# Patient Record
Sex: Female | Born: 1965 | Hispanic: No | Marital: Married | Smoking: Former smoker
Health system: Southern US, Community
[De-identification: ages and names within clinical notes are randomized; demographics above are authoritative.]

## PROBLEM LIST (undated history)

## (undated) DIAGNOSIS — J40 Bronchitis, not specified as acute or chronic: Secondary | ICD-10-CM

## (undated) HISTORY — PX: TOE SURGERY: SHX1073

## (undated) HISTORY — PX: CHOLECYSTECTOMY: SHX55

---

## 2006-12-29 LAB — HM PAP SMEAR

## 2009-08-08 ENCOUNTER — Other Ambulatory Visit: Admission: RE | Admit: 2009-08-08 | Discharge: 2009-08-08 | Payer: Self-pay | Admitting: *Deleted

## 2011-05-19 ENCOUNTER — Ambulatory Visit (INDEPENDENT_AMBULATORY_CARE_PROVIDER_SITE_OTHER): Payer: BC Managed Care – PPO | Admitting: Family Medicine

## 2011-05-19 ENCOUNTER — Encounter: Payer: Self-pay | Admitting: Family Medicine

## 2011-05-19 DIAGNOSIS — F331 Major depressive disorder, recurrent, moderate: Secondary | ICD-10-CM | POA: Insufficient documentation

## 2011-05-19 DIAGNOSIS — D696 Thrombocytopenia, unspecified: Secondary | ICD-10-CM

## 2011-05-19 DIAGNOSIS — N6019 Diffuse cystic mastopathy of unspecified breast: Secondary | ICD-10-CM | POA: Insufficient documentation

## 2011-05-19 DIAGNOSIS — F410 Panic disorder [episodic paroxysmal anxiety] without agoraphobia: Secondary | ICD-10-CM

## 2011-05-19 DIAGNOSIS — IMO0002 Reserved for concepts with insufficient information to code with codable children: Secondary | ICD-10-CM

## 2011-05-19 DIAGNOSIS — D6941 Evans syndrome: Secondary | ICD-10-CM

## 2011-05-19 DIAGNOSIS — M791 Myalgia, unspecified site: Secondary | ICD-10-CM

## 2011-05-19 DIAGNOSIS — F329 Major depressive disorder, single episode, unspecified: Secondary | ICD-10-CM

## 2011-05-19 DIAGNOSIS — F4000 Agoraphobia, unspecified: Secondary | ICD-10-CM | POA: Insufficient documentation

## 2011-05-19 DIAGNOSIS — F4001 Agoraphobia with panic disorder: Secondary | ICD-10-CM

## 2011-05-19 DIAGNOSIS — M797 Fibromyalgia: Secondary | ICD-10-CM

## 2011-05-19 DIAGNOSIS — R5383 Other fatigue: Secondary | ICD-10-CM

## 2011-05-19 DIAGNOSIS — Z13220 Encounter for screening for lipoid disorders: Secondary | ICD-10-CM

## 2011-05-19 DIAGNOSIS — T7422XA Child sexual abuse, confirmed, initial encounter: Secondary | ICD-10-CM

## 2011-05-19 DIAGNOSIS — IMO0001 Reserved for inherently not codable concepts without codable children: Secondary | ICD-10-CM

## 2011-05-19 DIAGNOSIS — D6861 Antiphospholipid syndrome: Secondary | ICD-10-CM | POA: Insufficient documentation

## 2011-05-19 NOTE — Assessment & Plan Note (Signed)
Poor control. Limits her activity as she does not leave house.  Husband not available to discuss possible referral to pshychiatrist.

## 2011-05-19 NOTE — Assessment & Plan Note (Signed)
Pt very agitated during exam. 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 Offered referral to pshychiatrist, pt not sure if she is interested in getting back on medication and will discuss with her husband.
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
## 2011-05-19 NOTE — Assessment & Plan Note (Signed)
?   Where diagnosis made for pt or if this is a correct diagnosis (She keeps saying fibro-cystic-myalgia and was diagnosed supposedly by breast MD as she cannot have mammogram on right breast). Will obtain past records from previous  Surgeon as per pt pain started after gallbladder surgery as well as neurologist. Pain is isolated to right side...will eval B12, TSH etc. She is hesitant abut medication to treat such as cymbalta, neurontin, etc.

## 2011-05-19 NOTE — Assessment & Plan Note (Signed)
Will reeval cbc to assure this is not causing pt's fatigue.

## 2011-05-19 NOTE — Patient Instructions (Signed)
Come in when able for lab evaluation. We will discuss results at complete physical appointment Call if interested in psychiatric referral. We will review current pain issues at complete physical exam.

## 2011-06-20 ENCOUNTER — Other Ambulatory Visit (INDEPENDENT_AMBULATORY_CARE_PROVIDER_SITE_OTHER): Payer: BC Managed Care – PPO

## 2011-06-20 DIAGNOSIS — F329 Major depressive disorder, single episode, unspecified: Secondary | ICD-10-CM

## 2011-06-20 DIAGNOSIS — R5383 Other fatigue: Secondary | ICD-10-CM

## 2011-06-20 DIAGNOSIS — M791 Myalgia, unspecified site: Secondary | ICD-10-CM

## 2011-06-20 DIAGNOSIS — Z13220 Encounter for screening for lipoid disorders: Secondary | ICD-10-CM

## 2011-06-20 DIAGNOSIS — D6941 Evans syndrome: Secondary | ICD-10-CM

## 2011-06-20 DIAGNOSIS — D696 Thrombocytopenia, unspecified: Secondary | ICD-10-CM

## 2011-06-20 DIAGNOSIS — IMO0001 Reserved for inherently not codable concepts without codable children: Secondary | ICD-10-CM

## 2011-06-20 LAB — CBC WITH DIFFERENTIAL/PLATELET
Eosinophils Relative: 1.8 % (ref 0.0–5.0)
HCT: 39.3 % (ref 36.0–46.0)
Monocytes Relative: 8.6 % (ref 3.0–12.0)
Neutrophils Relative %: 48.3 % (ref 43.0–77.0)
Platelets: 254 10*3/uL (ref 150.0–400.0)
WBC: 7.2 10*3/uL (ref 4.5–10.5)

## 2011-06-20 LAB — LIPID PANEL
LDL Cholesterol: 115 mg/dL — ABNORMAL HIGH (ref 0–99)
Total CHOL/HDL Ratio: 3
VLDL: 9.8 mg/dL (ref 0.0–40.0)

## 2011-06-20 LAB — COMPREHENSIVE METABOLIC PANEL
Albumin: 4.2 g/dL (ref 3.5–5.2)
CO2: 29 mEq/L (ref 19–32)
Chloride: 103 mEq/L (ref 96–112)
GFR: 154.76 mL/min (ref >60.00)
Glucose, Bld: 91 mg/dL (ref 70–99)
Potassium: 4.3 mEq/L (ref 3.5–5.1)
Sodium: 138 mEq/L (ref 135–145)
Total Protein: 6.6 g/dL (ref 6.0–8.3)

## 2011-06-20 LAB — TSH: TSH: 3.62 u[IU]/mL (ref 0.35–5.50)

## 2011-06-21 LAB — VITAMIN D 25 HYDROXY (VIT D DEFICIENCY, FRACTURES): Vit D, 25-Hydroxy: 29 ng/mL — ABNORMAL LOW (ref 30–89)

## 2011-06-27 ENCOUNTER — Ambulatory Visit (INDEPENDENT_AMBULATORY_CARE_PROVIDER_SITE_OTHER): Payer: BC Managed Care – PPO | Admitting: Family Medicine

## 2011-06-27 ENCOUNTER — Other Ambulatory Visit (HOSPITAL_COMMUNITY)
Admission: RE | Admit: 2011-06-27 | Discharge: 2011-06-27 | Disposition: A | Discharge status: Home / Self Care | Payer: BC Managed Care – PPO | Source: Ambulatory Visit | Attending: Family Medicine | Admitting: Family Medicine

## 2011-06-27 ENCOUNTER — Encounter: Payer: Self-pay | Admitting: Family Medicine

## 2011-06-27 VITALS — BP 118/72 | HR 61 | Temp 97.6°F | Ht 65.5 in | Wt 128.8 lb

## 2011-06-27 DIAGNOSIS — Z01419 Encounter for gynecological examination (general) (routine) without abnormal findings: Secondary | ICD-10-CM | POA: Insufficient documentation

## 2011-06-27 DIAGNOSIS — Q631 Lobulated, fused and horseshoe kidney: Secondary | ICD-10-CM | POA: Insufficient documentation

## 2011-06-27 DIAGNOSIS — Z1159 Encounter for screening for other viral diseases: Secondary | ICD-10-CM | POA: Insufficient documentation

## 2011-06-27 DIAGNOSIS — Z1231 Encounter for screening mammogram for malignant neoplasm of breast: Secondary | ICD-10-CM

## 2011-06-27 DIAGNOSIS — N644 Mastodynia: Secondary | ICD-10-CM

## 2011-06-27 DIAGNOSIS — Q638 Other specified congenital malformations of kidney: Secondary | ICD-10-CM

## 2011-06-27 DIAGNOSIS — F329 Major depressive disorder, single episode, unspecified: Secondary | ICD-10-CM

## 2011-06-27 DIAGNOSIS — F4000 Agoraphobia, unspecified: Secondary | ICD-10-CM

## 2011-06-27 DIAGNOSIS — F410 Panic disorder [episodic paroxysmal anxiety] without agoraphobia: Secondary | ICD-10-CM

## 2011-06-27 DIAGNOSIS — Z23 Encounter for immunization: Secondary | ICD-10-CM

## 2011-06-27 NOTE — Patient Instructions (Addendum)
Start ca and vit D tabs 600 mg/40 IU  twice daily.  Stop at front desk to schedule a mammogram for left breast and for referral.

## 2011-06-27 NOTE — Progress Notes (Signed)
Subjective:    Patient ID: Jackie Ford, female    DOB: 26-Feb-1966, 45 y.o.   MRN: [id number]  HPI   The patient is here for annual wellness exam and preventative care.    Panic attacks , agoraphbia:  Continued issues with this.  She does admit pain was better controlled when anxiety was better controlled on zoloft and  Name brand xanax.  She did have bad experience with pshychiatrist in past.  Fibromyalgia and right sided body pain:   Review of Systems  Constitutional: Negative for fever, fatigue and unexpected weight change.  HENT: Negative for ear pain, congestion, sore throat, sneezing, trouble swallowing and sinus pressure.   Eyes: Negative for pain and itching.  Respiratory: Negative for cough, shortness of breath and wheezing.   Cardiovascular: Negative for chest pain, palpitations and leg swelling.  Gastrointestinal: Negative for nausea, abdominal pain, diarrhea, constipation and blood in stool.  Genitourinary: Negative for dysuria, hematuria, vaginal discharge, difficulty urinating and menstrual problem.       Has noted bruising on right breast.. No known injury   She has chronic pain and history of fibrocystiuc disease in right breast  Musculoskeletal:       Pain to palpation on right side of body.  Skin: Negative for rash.  Neurological: Negative for syncope, weakness, light-headedness, numbness and headaches.  Psychiatric/Behavioral: Negative for confusion and dysphoric mood. The patient is not nervous/anxious.        Objective:   Physical Exam  Constitutional: Vital signs are normal. She appears well-developed and well-nourished. She is cooperative.  Non-toxic appearance. She does not appear ill. No distress.  HENT:  Head: Normocephalic.  Right Ear: Hearing, tympanic membrane, external ear and ear canal normal.  Left Ear: Hearing, tympanic membrane, external ear and ear canal normal.  Nose: Nose normal.  Eyes: Conjunctivae, EOM and lids are normal. Pupils are  equal, round, and reactive to light. No foreign bodies found.  Neck: Trachea normal and normal range of motion. Neck supple. Carotid bruit is not present. No mass and no thyromegaly present.  Cardiovascular: Normal rate, regular rhythm, S1 normal, S2 normal, normal heart sounds and intact distal pulses.  Exam reveals no gallop.   No murmur heard. Pulmonary/Chest: Effort normal and breath sounds normal. No respiratory distress. She has no wheezes. She has no rhonchi. She has no rales.  Abdominal: Soft. Normal appearance and bowel sounds are normal. She exhibits no distension, no fluid wave, no abdominal bruit and no mass. There is no hepatosplenomegaly. There is no tenderness. There is no rebound, no guarding and no CVA tenderness. No hernia.  Genitourinary: Vagina normal and uterus normal. No breast swelling, tenderness, discharge or bleeding. Pelvic exam was performed with patient prone. There is no rash, tenderness or lesion on the right labia. There is no rash, tenderness or lesion on the left labia. Uterus is not enlarged and not tender. Cervix exhibits motion tenderness. Cervix exhibits no discharge and no friability. Right adnexum displays no mass, no tenderness and no fullness. Left adnexum displays no mass, no tenderness and no fullness.  Lymphadenopathy:    She has no cervical adenopathy.    She has no axillary adenopathy.  Neurological: She is alert. She has normal strength. No cranial nerve deficit or sensory deficit.  Skin: Skin is warm, dry and intact. No rash noted.  Psychiatric: Her speech is normal and behavior is normal. Judgment normal. Her mood appears not anxious. Cognition and memory are normal. She does not exhibit a depressed  mood.          Assessment & Plan:  Complete Physical Exam: The patient's preventative maintenance and recommended screening tests for an annual wellness exam were reviewed in full today. Brought up to date unless services declined.  Counselled on  the importance of diet, exercise, and its role in overall health and mortality. The patient's FH and SH was reviewed, including their home life, tobacco status, and drug and alcohol status.

## 2011-07-01 NOTE — Progress Notes (Signed)
Addended by: Kerby Nora E on: 07/01/2011 05:05 PM   Modules accepted: Orders

## 2011-07-04 ENCOUNTER — Encounter: Payer: Self-pay | Admitting: *Deleted

## 2011-09-29 ENCOUNTER — Telehealth: Payer: Self-pay | Admitting: *Deleted

## 2011-09-29 MED ORDER — TRAMADOL HCL 50 MG PO TABS: 50.0000 mg | ORAL_TABLET | Freq: Four times a day (QID) | ORAL | Status: AC | PRN

## 2011-09-29 NOTE — Telephone Encounter (Signed)
Pt needs a new order for mammogram sent to [facility], she had appt previously but didn't go, they told her that order was cancelled.  Also, she would like something for the pain that is involved in the procedure called to [address].  Husband says she was in pain for 3 months after a previous mammogram.  Please let him know when done.

## 2011-09-29 NOTE — Telephone Encounter (Signed)
Marion.. Do I need to reorder or can we simply send order again? Heather: sent in tramadol for pain to use only as needed.

## 2011-09-30 NOTE — Telephone Encounter (Signed)
Dr Ermalene Searing, I have faxed over to the [facility] patients last mammogram report that we have from 2002. I have spoken to the patients husband about this and apparently she has had more recent radiology done in 2007. Husband will call to get previous films sent to breast ctr and I ha=ve asked the breast ctr to let us know what you need to order for her.

## 2011-10-01 ENCOUNTER — Other Ambulatory Visit: Payer: Self-pay | Admitting: Family Medicine

## 2011-10-01 DIAGNOSIS — N644 Mastodynia: Secondary | ICD-10-CM

## 2011-10-01 NOTE — Telephone Encounter (Signed)
I thought we already ordered this months back.. Can we not use same order? She had cancelled appt.

## 2011-10-02 NOTE — Telephone Encounter (Signed)
[facility] sent Dr Ermalene Searing an order for Bilateral Diagnostic MMG and [place] with possible biopsy if needed. Order was signed and faxed to [facility]. Rayford Halsted will call the patient to schedule.

## 2011-10-03 ENCOUNTER — Ambulatory Visit (INDEPENDENT_AMBULATORY_CARE_PROVIDER_SITE_OTHER): Payer: BC Managed Care – PPO | Admitting: Family Medicine

## 2011-10-03 ENCOUNTER — Encounter: Payer: Self-pay | Admitting: Family Medicine

## 2011-10-03 DIAGNOSIS — N644 Mastodynia: Secondary | ICD-10-CM

## 2011-10-03 DIAGNOSIS — F329 Major depressive disorder, single episode, unspecified: Secondary | ICD-10-CM

## 2011-10-03 DIAGNOSIS — F4000 Agoraphobia, unspecified: Secondary | ICD-10-CM

## 2011-10-03 DIAGNOSIS — IMO0001 Reserved for inherently not codable concepts without codable children: Secondary | ICD-10-CM

## 2011-10-03 DIAGNOSIS — F410 Panic disorder [episodic paroxysmal anxiety] without agoraphobia: Secondary | ICD-10-CM

## 2011-10-03 DIAGNOSIS — M797 Fibromyalgia: Secondary | ICD-10-CM

## 2011-10-03 MED ORDER — SERTRALINE HCL 50 MG PO TABS: 50.0000 mg | ORAL_TABLET | Freq: Every day | ORAL | Status: DC

## 2011-10-03 NOTE — Patient Instructions (Addendum)
Start zoloft at bedtime. Call if side effects or worsening pain or mood.

## 2011-10-03 NOTE — Progress Notes (Signed)
  Subjective:    Patient ID: Jackie Ford, female    DOB: 08-07-1966, 45 y.o.   MRN: [id number]  HPI Right breast pain.. She is open to getting full mammogram done. We have prescribed tramadol to help with pain. She has history of cyst and abnormalities in right breast.  Fibromyalgia, right sided body pain: stable control but not great. Occurs when stressed out.  Depression, agoraphobia, anxiety, poor control... Never saw pshychiatrist..she got nervous and cancelled appt.  Trouble falling asleep due to fears.  Sleeps a lot during the day. Isolated at home...never leave house. Only talks with husband and animals.  Her animals are her family. Per husband she has things barricade in front of window to keep out people and criminals.  Had SE to cymbalta in past.  Has tolerated zoloft in past. She feels most comfortable with this medication. Nervous about other meds like lyrica, neurontin and amitryptiline.     Review of Systems  Constitutional: Negative for fever and fatigue.  HENT: Negative for ear pain.   Eyes: Negative for pain.  Respiratory: Negative for chest tightness and shortness of breath.   Cardiovascular: Negative for chest pain, palpitations and leg swelling.  Gastrointestinal: Negative for abdominal pain.  Genitourinary: Negative for dysuria.  Psychiatric/Behavioral: Positive for dysphoric mood and agitation. Negative for suicidal ideas, hallucinations and self-injury.       Objective:   Physical Exam  Constitutional: Vital signs are normal. She appears well-developed and well-nourished. She is cooperative.  Non-toxic appearance. She does not appear ill. No distress.  HENT:  Head: Normocephalic.  Right Ear: Hearing, tympanic membrane, external ear and ear canal normal. Tympanic membrane is not erythematous, not retracted and not bulging.  Left Ear: Hearing, tympanic membrane, external ear and ear canal normal. Tympanic membrane is not erythematous, not retracted and not  bulging.  Nose: No mucosal edema or rhinorrhea. Right sinus exhibits no maxillary sinus tenderness and no frontal sinus tenderness. Left sinus exhibits no maxillary sinus tenderness and no frontal sinus tenderness.  Mouth/Throat: Uvula is midline, oropharynx is clear and moist and mucous membranes are normal.  Eyes: Conjunctivae, EOM and lids are normal. Pupils are equal, round, and reactive to light. No foreign bodies found.  Neck: Trachea normal and normal range of motion. Neck supple. Carotid bruit is not present. No mass and no thyromegaly present.  Cardiovascular: Normal rate, regular rhythm, S1 normal, S2 normal, normal heart sounds, intact distal pulses and normal pulses.  Exam reveals no gallop and no friction rub.   No murmur heard. Pulmonary/Chest: Effort normal and breath sounds normal. Not tachypneic. No respiratory distress. She has no decreased breath sounds. She has no wheezes. She has no rhonchi. She has no rales.  Abdominal: Soft. Normal appearance and bowel sounds are normal. There is no tenderness.  Neurological: She is alert.  Skin: Skin is warm, dry and intact. No rash noted.  Psychiatric: Thought content normal. Her mood appears anxious. Her speech is tangential. She is agitated. Cognition and memory are normal. She expresses inappropriate judgment. She does not exhibit a depressed mood.          Assessment & Plan:

## 2011-10-10 DIAGNOSIS — N644 Mastodynia: Secondary | ICD-10-CM | POA: Insufficient documentation

## 2011-10-10 NOTE — Assessment & Plan Note (Addendum)
Chronic... Will go for mammogram B breasts, will use tramadol prior to procedure given severe pain with procedure in past.

## 2011-10-10 NOTE — Assessment & Plan Note (Signed)
Right sided body pain closely related to stress and anxiety... We will begin treatment with SSRI as opposed to other options because she has tried this in the  Past and has the least concerns about taking this.

## 2011-10-10 NOTE — Assessment & Plan Note (Signed)
Total visit time 30 minutes, > 50% spent counseling and cordinating patients care. I continue to recommend pshychiatry and counsleling... Pt very hesitant given past experiences despite husband being encouraging to the neccessity of treatment. We will begin zolfot 50 mg daily... Bipolar questionnaires negative... To help with her dramatic isolation due to agoraphobia and other fears.. As well as to try to begin treatment for her fibromyalgia.

## 2011-10-17 ENCOUNTER — Ambulatory Visit: Payer: BC Managed Care – PPO | Admitting: Family Medicine

## 2011-10-23 ENCOUNTER — Ambulatory Visit: Payer: BC Managed Care – PPO | Admitting: Family Medicine

## 2012-01-19 ENCOUNTER — Ambulatory Visit
Admission: RE | Admit: 2012-01-19 | Discharge: 2012-01-19 | Disposition: A | Discharge status: Home / Self Care | Payer: BC Managed Care – PPO | Source: Ambulatory Visit | Attending: Family Medicine | Admitting: Family Medicine

## 2012-01-19 ENCOUNTER — Other Ambulatory Visit: Payer: Self-pay | Admitting: Family Medicine

## 2012-01-19 ENCOUNTER — Encounter: Payer: Self-pay | Admitting: *Deleted

## 2012-01-19 DIAGNOSIS — N644 Mastodynia: Secondary | ICD-10-CM

## 2012-06-04 ENCOUNTER — Ambulatory Visit: Payer: Self-pay | Admitting: Family Medicine

## 2012-06-04 ENCOUNTER — Encounter: Payer: Self-pay | Admitting: Family Medicine

## 2012-06-04 ENCOUNTER — Ambulatory Visit (INDEPENDENT_AMBULATORY_CARE_PROVIDER_SITE_OTHER): Payer: BC Managed Care – PPO | Admitting: Family Medicine

## 2012-06-04 VITALS — BP 112/76 | HR 80 | Temp 98.6°F | Wt 127.8 lb

## 2012-06-04 DIAGNOSIS — M79609 Pain in unspecified limb: Secondary | ICD-10-CM

## 2012-06-04 DIAGNOSIS — D699 Hemorrhagic condition, unspecified: Secondary | ICD-10-CM | POA: Insufficient documentation

## 2012-06-04 DIAGNOSIS — D6941 Evans syndrome: Secondary | ICD-10-CM

## 2012-06-04 DIAGNOSIS — M79605 Pain in left leg: Secondary | ICD-10-CM | POA: Insufficient documentation

## 2012-06-04 LAB — CBC
Hemoglobin: 13.9 g/dL (ref 12.0–15.0)
MCH: 28.3 pg (ref 26.0–34.0)
MCV: 86.4 fL (ref 78.0–100.0)
Platelets: 299 10*3/uL (ref 150–400)
RBC: 4.92 MIL/uL (ref 3.87–5.11)
WBC: 7.3 10*3/uL (ref 4.0–10.5)

## 2012-06-04 NOTE — Patient Instructions (Signed)
Stop at lab and front desk to set up lab and dopplers of leg.

## 2012-06-04 NOTE — Assessment & Plan Note (Signed)
Will rule out DVT, although superficial phlebitits vs muscle strain more likely. If dopplers negative.. Treat with heat, massage, stretching.

## 2012-06-04 NOTE — Assessment & Plan Note (Signed)
Will evaluate with cbc.

## 2012-06-04 NOTE — Progress Notes (Signed)
Jackie Ford from Ultrasound called to report that the left leg doppler was negative for DVT.  Per Dr. Ermalene Searing, patient advised to go home and follow the instructions given at OV.

## 2012-06-04 NOTE — Progress Notes (Signed)
  Subjective:    Patient ID: Jackie Ford, female    DOB: 1966/11/16, 46 y.o.   MRN: [id number]  HPI 46 year old female presents with 1 week history bruising mark on left lateral leg.    No leg redness.   No known injury. Brusing now gone. Now with pain in left lateral leg, from hip to foot. No leg swelling.  Pain with standing, but greater when sitting.  Brother had thrombocytopenia.  Review of Systems  Constitutional: Negative for fever and fatigue.  Eyes: Negative for pain.  Respiratory: Negative for shortness of breath.   Cardiovascular: Negative for chest pain, palpitations and leg swelling.  Gastrointestinal: Negative for abdominal distention.  Genitourinary: Negative for dysuria.       Objective:   Physical Exam  Constitutional: Vital signs are normal. She appears well-developed and well-nourished. She is cooperative.  Non-toxic appearance. She does not appear ill. No distress.  HENT:  Head: Normocephalic.  Right Ear: Hearing, tympanic membrane, external ear and ear canal normal. Tympanic membrane is not erythematous, not retracted and not bulging.  Left Ear: Hearing, tympanic membrane, external ear and ear canal normal. Tympanic membrane is not erythematous, not retracted and not bulging.  Nose: No mucosal edema or rhinorrhea. Right sinus exhibits no maxillary sinus tenderness and no frontal sinus tenderness. Left sinus exhibits no maxillary sinus tenderness and no frontal sinus tenderness.  Mouth/Throat: Uvula is midline, oropharynx is clear and moist and mucous membranes are normal.  Eyes: Conjunctivae, EOM and lids are normal. Pupils are equal, round, and reactive to light. No foreign bodies found.  Neck: Trachea normal and normal range of motion. Neck supple. Carotid bruit is not present. No mass and no thyromegaly present.  Cardiovascular: Normal rate, regular rhythm, S1 normal, S2 normal, normal heart sounds, intact distal pulses and normal pulses.  Exam reveals no gallop  and no friction rub.   No murmur heard. Pulmonary/Chest: Effort normal and breath sounds normal. Not tachypneic. No respiratory distress. She has no decreased breath sounds. She has no wheezes. She has no rhonchi. She has no rales.  Abdominal: Soft. Normal appearance and bowel sounds are normal. There is no tenderness.  Musculoskeletal:       ttp in left buttock, left lateral leg and sole of foot, minimal swelling, no redness, no cord.  Most tender over area of healed brusing at tendon) Neg SLR, no low back pain.  Neurological: She is alert.  Skin: Skin is warm, dry and intact. No rash noted.  Psychiatric: Her speech is normal and behavior is normal. Judgment and thought content normal. Her mood appears not anxious. Cognition and memory are normal. She does not exhibit a depressed mood.          Assessment & Plan:

## 2012-06-07 ENCOUNTER — Encounter: Payer: Self-pay | Admitting: Family Medicine

## 2013-01-13 ENCOUNTER — Encounter: Payer: Self-pay | Admitting: Family Medicine

## 2013-01-13 ENCOUNTER — Ambulatory Visit (INDEPENDENT_AMBULATORY_CARE_PROVIDER_SITE_OTHER): Payer: BC Managed Care – PPO | Admitting: Family Medicine

## 2013-01-13 VITALS — BP 120/70 | HR 82 | Temp 98.2°F | Ht 65.5 in | Wt 131.5 lb

## 2013-01-13 DIAGNOSIS — J209 Acute bronchitis, unspecified: Secondary | ICD-10-CM | POA: Insufficient documentation

## 2013-01-13 MED ORDER — AZITHROMYCIN 250 MG PO TABS: ORAL_TABLET | ORAL | Status: DC

## 2013-01-13 NOTE — Progress Notes (Signed)
  Subjective:    Patient ID: Jackie Ford, female    DOB: 1966/09/25, 47 y.o.   MRN: [id number]  Cough This is a new problem. Episode onset: 12/30. The problem has been gradually worsening. The problem occurs constantly. The cough is productive of sputum. Associated symptoms include chills and nasal congestion. Pertinent negatives include no weight loss. Associated symptoms comments: Initial body ache  mouth , fingertip and toes numb  green and bloody nasal discharge   diarrhea x week, no vomiting  Low grade temp. Nothing aggravates the symptoms. Risk factors: nonsmoker. Treatments tried: Nyquil at bedtime. The treatment provided mild relief. There is no history of asthma, bronchitis, COPD or emphysema.      Review of Systems  Constitutional: Positive for chills. Negative for weight loss.  Respiratory: Positive for cough.        Objective:   Physical Exam  Constitutional: Vital signs are normal. She appears well-developed and well-nourished. She is cooperative.  Non-toxic appearance. She does not appear ill. No distress.  HENT:  Head: Normocephalic.  Right Ear: Hearing, tympanic membrane, external ear and ear canal normal. Tympanic membrane is not erythematous, not retracted and not bulging.  Left Ear: Hearing, tympanic membrane, external ear and ear canal normal. Tympanic membrane is not erythematous, not retracted and not bulging.  Nose: Mucosal edema and rhinorrhea present. Right sinus exhibits no maxillary sinus tenderness and no frontal sinus tenderness. Left sinus exhibits no maxillary sinus tenderness and no frontal sinus tenderness.  Mouth/Throat: Uvula is midline, oropharynx is clear and moist and mucous membranes are normal.  Eyes: Conjunctivae normal, EOM and lids are normal. Pupils are equal, round, and reactive to light. No foreign bodies found.  Neck: Trachea normal and normal range of motion. Neck supple. Carotid bruit is not present. No mass and no thyromegaly present.    Cardiovascular: Normal rate, regular rhythm, S1 normal, S2 normal, normal heart sounds, intact distal pulses and normal pulses.  Exam reveals no gallop and no friction rub.   No murmur heard. Pulmonary/Chest: Effort normal and breath sounds normal. Not tachypneic. No respiratory distress. She has no decreased breath sounds. She has no wheezes. She has no rhonchi. She has no rales.  Neurological: She is alert.  Skin: Skin is warm, dry and intact. No rash noted.  Psychiatric: Her speech is normal and behavior is normal. Judgment normal. Her mood appears not anxious. Cognition and memory are normal. She does not exhibit a depressed mood.          Assessment & Plan:

## 2013-01-13 NOTE — Assessment & Plan Note (Signed)
Likely initial viral infection now with superimposed bacterial infection vs walking pneumonia. Treat with Z-pak ( has not tolerated minocyclin/doxycycline family in past) Symptomatic care otherwise.

## 2013-01-13 NOTE — Patient Instructions (Addendum)
Mucinex DM and Nyquil at bedtime. Complete antibiotics. Push fluids. Rest. If severe shortness of breath.. Go to ER.

## 2013-01-26 ENCOUNTER — Emergency Department (HOSPITAL_COMMUNITY): Payer: BC Managed Care – PPO

## 2013-01-26 ENCOUNTER — Encounter (HOSPITAL_COMMUNITY): Payer: Self-pay | Admitting: Emergency Medicine

## 2013-01-26 ENCOUNTER — Emergency Department (HOSPITAL_COMMUNITY)
Admission: EM | Admit: 2013-01-26 | Discharge: 2013-01-26 | Disposition: A | Discharge status: Home / Self Care | Payer: BC Managed Care – PPO | Attending: Emergency Medicine | Admitting: Emergency Medicine

## 2013-01-26 ENCOUNTER — Emergency Department (HOSPITAL_COMMUNITY)
Admission: EM | Admit: 2013-01-26 | Discharge: 2013-01-26 | Disposition: A | Discharge status: Nursing Home (Includes ICF) | Payer: BC Managed Care – PPO | Source: Home / Self Care | Attending: Emergency Medicine | Admitting: Emergency Medicine

## 2013-01-26 ENCOUNTER — Encounter (HOSPITAL_COMMUNITY): Payer: Self-pay | Admitting: Family Medicine

## 2013-01-26 ENCOUNTER — Emergency Department (INDEPENDENT_AMBULATORY_CARE_PROVIDER_SITE_OTHER): Payer: BC Managed Care – PPO

## 2013-01-26 DIAGNOSIS — R079 Chest pain, unspecified: Secondary | ICD-10-CM

## 2013-01-26 DIAGNOSIS — F410 Panic disorder [episodic paroxysmal anxiety] without agoraphobia: Secondary | ICD-10-CM

## 2013-01-26 DIAGNOSIS — Z3202 Encounter for pregnancy test, result negative: Secondary | ICD-10-CM | POA: Insufficient documentation

## 2013-01-26 DIAGNOSIS — R112 Nausea with vomiting, unspecified: Secondary | ICD-10-CM

## 2013-01-26 DIAGNOSIS — E876 Hypokalemia: Secondary | ICD-10-CM

## 2013-01-26 DIAGNOSIS — Z8709 Personal history of other diseases of the respiratory system: Secondary | ICD-10-CM | POA: Insufficient documentation

## 2013-01-26 DIAGNOSIS — R0789 Other chest pain: Secondary | ICD-10-CM

## 2013-01-26 DIAGNOSIS — Z79899 Other long term (current) drug therapy: Secondary | ICD-10-CM | POA: Insufficient documentation

## 2013-01-26 DIAGNOSIS — R071 Chest pain on breathing: Secondary | ICD-10-CM | POA: Insufficient documentation

## 2013-01-26 DIAGNOSIS — R197 Diarrhea, unspecified: Secondary | ICD-10-CM

## 2013-01-26 DIAGNOSIS — R109 Unspecified abdominal pain: Secondary | ICD-10-CM | POA: Insufficient documentation

## 2013-01-26 DIAGNOSIS — Z87891 Personal history of nicotine dependence: Secondary | ICD-10-CM | POA: Insufficient documentation

## 2013-01-26 HISTORY — DX: Bronchitis, not specified as acute or chronic: J40

## 2013-01-26 LAB — URINALYSIS, ROUTINE W REFLEX MICROSCOPIC
Ketones, ur: 15 mg/dL — AB
Leukocytes, UA: NEGATIVE
Nitrite: NEGATIVE
Protein, ur: NEGATIVE mg/dL
pH: 6.5 (ref 5.0–8.0)

## 2013-01-26 LAB — CBC WITH DIFFERENTIAL/PLATELET
Basophils Absolute: 0 10*3/uL (ref 0.0–0.1)
Basophils Relative: 0 % (ref 0–1)
Basophils Relative: 1 % (ref 0–1)
Eosinophils Absolute: 0.1 10*3/uL (ref 0.0–0.7)
Eosinophils Relative: 1 % (ref 0–5)
Eosinophils Relative: 1 % (ref 0–5)
HCT: 39.3 % (ref 36.0–46.0)
Hemoglobin: 13.7 g/dL (ref 12.0–15.0)
MCH: 28.7 pg (ref 26.0–34.0)
MCH: 29.4 pg (ref 26.0–34.0)
MCHC: 34.9 g/dL (ref 30.0–36.0)
MCHC: 34.9 g/dL (ref 30.0–36.0)
MCV: 82.2 fL (ref 78.0–100.0)
MCV: 84.3 fL (ref 78.0–100.0)
Monocytes Absolute: 0.5 10*3/uL (ref 0.1–1.0)
Monocytes Relative: 8 % (ref 3–12)
Neutro Abs: 3.4 10*3/uL (ref 1.7–7.7)
Platelets: 234 10*3/uL (ref 150–400)
RDW: 13.5 % (ref 11.5–15.5)
WBC: 7.1 10*3/uL (ref 4.0–10.5)

## 2013-01-26 LAB — COMPREHENSIVE METABOLIC PANEL
Albumin: 3.6 g/dL (ref 3.5–5.2)
BUN: 8 mg/dL (ref 6–23)
Calcium: 8.6 mg/dL (ref 8.4–10.5)
Chloride: 107 mEq/L (ref 96–112)
Creatinine, Ser: 0.55 mg/dL (ref 0.50–1.10)
GFR calc non Af Amer: >90 mL/min (ref >90)
Total Bilirubin: 0.4 mg/dL (ref 0.3–1.2)

## 2013-01-26 LAB — POCT I-STAT, CHEM 8
HCT: 42 % (ref 36.0–46.0)
Sodium: 140 mEq/L (ref 135–145)
TCO2: 22 mmol/L (ref 0–100)

## 2013-01-26 LAB — URINE MICROSCOPIC-ADD ON

## 2013-01-26 LAB — LIPASE, BLOOD: Lipase: 30 U/L (ref 11–59)

## 2013-01-26 MED ORDER — POTASSIUM CHLORIDE 10 MEQ/100ML IV SOLN
10.0000 meq | Freq: Once | INTRAVENOUS | Status: AC
  Administered 2013-01-26: 10 meq via INTRAVENOUS
  Filled 2013-01-26: qty 100

## 2013-01-26 MED ORDER — IOHEXOL 300 MG/ML  SOLN
50.0000 mL | Freq: Once | INTRAMUSCULAR | Status: AC | PRN
  Administered 2013-01-26: 50 mL via ORAL

## 2013-01-26 MED ORDER — IOHEXOL 300 MG/ML  SOLN
100.0000 mL | Freq: Once | INTRAMUSCULAR | Status: AC | PRN
  Administered 2013-01-26: 100 mL via INTRAVENOUS

## 2013-01-26 MED ORDER — LORAZEPAM 2 MG/ML IJ SOLN
1.0000 mg | Freq: Once | INTRAMUSCULAR | Status: AC
  Administered 2013-01-26: 1 mg via INTRAVENOUS
  Filled 2013-01-26: qty 1

## 2013-01-26 MED ORDER — SODIUM CHLORIDE 0.9 % IV BOLUS (SEPSIS)
1000.0000 mL | Freq: Once | INTRAVENOUS | Status: AC
  Administered 2013-01-26: 1000 mL via INTRAVENOUS

## 2013-01-26 MED ORDER — ONDANSETRON HCL 4 MG/2ML IJ SOLN
4.0000 mg | Freq: Once | INTRAMUSCULAR | Status: AC
  Administered 2013-01-26: 4 mg via INTRAVENOUS
  Filled 2013-01-26: qty 2

## 2013-01-26 MED ORDER — POTASSIUM CHLORIDE CRYS ER 20 MEQ PO TBCR
40.0000 meq | EXTENDED_RELEASE_TABLET | Freq: Once | ORAL | Status: AC
  Administered 2013-01-26: 40 meq via ORAL
  Filled 2013-01-26: qty 2

## 2013-01-26 MED ORDER — ONDANSETRON HCL 4 MG PO TABS: 4.0000 mg | ORAL_TABLET | Freq: Four times a day (QID) | ORAL | Status: DC

## 2013-01-26 NOTE — ED Provider Notes (Signed)
History  This chart was scribed for Jackie Octave, MD by [name], ED Scribe. The patient was seen in room A03C/A03C. Patient's care was started at 1912.  CSN: [id number]  Arrival date & time 01/26/13  1405   First MD Initiated Contact with Patient 01/26/13 1912      Chief Complaint  Patient presents with  . Chest Pain  . Abdominal Pain    The history is provided by the patient. No language interpreter was used.    HPI Comments: Jackie Ford is a 47 y.o. female who presents to the Emergency Department from [facility] complaining left chest pain that radiates to left upper extermity onset this morning when she woke up, 9.5 hours ago. The episode was moderate in severity and lasted for 4 hours. Patient says that pain is both sharp and pressure-like. She states that anxiety intensifies pain. Patient says that pain has returned and is the same as prior episode of pain earlier today. There is associated nausea and shortness of breath. She went to Urgent Care this morning after symptoms began and had an EKG and x-ray which were unremarkable, but patient was sent here for further evaluation and monitoring.   Patient is also complaining of gradually worsening, diffuse abdominal pain onset more than 1 week ago that is worse after eating food. Patient has had associated decreased food intake and appetite. Patient states that she has been taking prilosec for the past 3 days with no improvement. She has had transient relief from abdominal pain with Pepto-Bismol. Patient denies dizziness, lightheadedness, fever, back pain, leg swelling, leg pain or sore throat.  Patient states that she has been having intermittent flu like symptoms for the past 4 weeks. Patient was diagnosed with acute bronchitis 2 weeks ago by PCP and given azithromycin which she has now finished. She has also been taking Mucinex and Nyquil. Patient does not take any other medicines. Patient denies history of HTN or diabetes. She has  a family history of MI (father at 73 and grandfather). Patient has a surgical history of cholecystectomy (2008). Patient did not get a flu shot this year.   Past Medical History  Diagnosis Date  . Bronchitis     Past Surgical History  Procedure Date  . Cholecystectomy   . Toe surgery     Family History  Problem Relation Age of Onset  . Cancer Maternal Grandmother     breast  . Cancer Paternal Grandmother     breast  . Thrombocytopenia Mother   . Heart disease Father 37    CABG  . Thrombocytopenia Brother   . Mental illness Brother     History  Substance Use Topics  . Smoking status: Former Games developer  . Smokeless tobacco: Not on file  . Alcohol Use: No    OB History    Grav Para Term Preterm Abortions TAB SAB Ect Mult Living                  Review of Systems A complete 10 system review of systems was obtained and all systems are negative except as noted in the HPI and PMH.   Allergies  Onion; Chocolate; Minocycline hcl; Nicotine; Oxycodone; and Peanut-containing drug products  Home Medications   Current Outpatient Rx  Name  Route  Sig  Dispense  Refill  . MULTIVITAMIN GUMMIES ADULT PO   Oral   Take 1 tablet by mouth daily.         Marland Kitchen OMEPRAZOLE 20 MG  PO CPDR   Oral   Take 20 mg by mouth daily.         Marland Kitchen ONDANSETRON HCL 4 MG PO TABS   Oral   Take 1 tablet (4 mg total) by mouth every 6 (six) hours.   12 tablet   0     Triage Vitals: BP 156/73  Pulse 74  Resp 18  SpO2 99%  Physical Exam  Constitutional: She is oriented to person, place, and time. She appears well-developed and well-nourished. No distress.  HENT:  Head: Normocephalic and atraumatic.  Mouth/Throat: Oropharynx is clear and moist. Mucous membranes are dry.  Eyes: Conjunctivae normal are normal. Pupils are equal, round, and reactive to light.  Neck: Neck supple.  Cardiovascular: Normal rate and regular rhythm.   No murmur heard. Pulmonary/Chest: Effort normal and breath sounds  normal. No respiratory distress. She has no decreased breath sounds. She has no wheezes. She has no rhonchi. She has no rales.       Reproducible left sided chest pain.  Abdominal: Soft. Normal appearance and bowel sounds are normal. She exhibits no distension. There is no tenderness. There is no CVA tenderness.  Musculoskeletal: She exhibits no edema.  Neurological: She is alert and oriented to person, place, and time.       Equal grip strength bilaterally.  Skin: Skin is warm and dry. No rash noted.  Psychiatric: Her speech is normal. Her mood appears anxious.    ED Course  Procedures (including critical care time)  DIAGNOSTIC STUDIES: Oxygen Saturation is 99% on room air, normal by my interpretation.    COORDINATION OF CARE: 7:33 PM- Patient informed of current plan for treatment and evaluation and agrees with plan at this time.     Labs Reviewed  COMPREHENSIVE METABOLIC PANEL - Abnormal; Notable for the following:    Potassium 3.2 (*)     All other components within normal limits  URINALYSIS, ROUTINE W REFLEX MICROSCOPIC - Abnormal; Notable for the following:    Hgb urine dipstick TRACE (*)     Ketones, ur 15 (*)     All other components within normal limits  CBC WITH DIFFERENTIAL  LIPASE, BLOOD  PREGNANCY, URINE  RAPID STREP SCREEN  TROPONIN I  D-DIMER, QUANTITATIVE  LACTIC ACID, PLASMA  URINE MICROSCOPIC-ADD ON    Dg Chest 2 View  01/26/2013  *RADIOLOGY REPORT*  Clinical Data: Left-sided chest pain.  CHEST - 2 VIEW  Comparison: No priors.  Findings: Lung volumes are normal.  No consolidative airspace disease.  No pleural effusions.  No pneumothorax.  No pulmonary nodule or mass noted.  Pulmonary vasculature and the cardiomediastinal silhouette are within normal limits.  IMPRESSION: 1. No radiographic evidence of acute cardiopulmonary disease.   Original Report Authenticated By: Trudie Reed, M.D.    Ct Abdomen Pelvis W Contrast  01/26/2013  *RADIOLOGY REPORT*   Clinical Data: 47 year old female with abdominal and pelvic pain.  CT ABDOMEN AND PELVIS WITH CONTRAST  Technique:  Multidetector CT imaging of the abdomen and pelvis was performed following the standard protocol during bolus administration of intravenous contrast.  Contrast: OMNIPAQUE IOHEXOL 300 MG/ML  SOLN  Comparison: None  Findings: The liver, spleen, pancreas, and adrenal glands are unremarkable. A horseshoe kidney is present.  There is no evidence of hydronephrosis, urinary calculi or renal mass. The patient is status post cholecystectomy.  The bowel, appendix and bladder are unremarkable. A trace amount of free pelvic fluid may be physiologic. The uterus and adnexal regions  are within normal limits.  There is no evidence of bowel obstruction, pneumoperitoneum or abscess.  No enlarged lymph nodes, biliary dilation or abdominal aortic aneurysm identified.  No acute or suspicious bony abnormalities are identified.  IMPRESSION: No acute abnormalities except for trace free pelvic fluid which is likely physiologic.  Horseshoe kidney.   Original Report Authenticated By: Harmon Pier, M.D.      1. Chest wall pain   2. Hypokalemia   3. Nausea and vomiting       MDM  Left-sided chest pain that has been constant since this morning for the past 4 hours. Associated with nausea. Patient with illness for the past one month of cough congestion, nausea and vomiting and diffuse abdominal pain. Treated for bronchitis with azithromycin.  Labs remarkable for mild hypokalemia, replaced. Chest pain is reproducible and EKG nonischemic, atypical for ACS. Patient appears quite anxious but well and nontoxic. Abdomen is completely soft and benign.  Symptoms controlled with IVF and zofran. No vomiting in ED. Tolerating PO.   Date: 01/26/2013  Rate: 74  Rhythm: normal sinus rhythm  QRS Axis: normal  Intervals: normal  ST/T Wave abnormalities: normal  Conduction Disutrbances:none  Narrative Interpretation:    Old EKG Reviewed: none available     I personally performed the services described in this documentation, which was scribed in my presence. The recorded information has been reviewed and is accurate.    Jackie Octave, MD 01/27/13 [phone]

## 2013-01-26 NOTE — ED Notes (Signed)
Pt back from CT

## 2013-01-26 NOTE — ED Notes (Addendum)
Per pt and family pt not feeling well over the last month. Treated for bronchitis with z pac and since has had abdominal pain, vomiting and this am woke up with chest pain radiating down arm. Pt sent from [facility]. Had EKG and labs drawn there. K 2.9. sts she had some onion on Thursday and she is allergic. sts this is when symptoms started.

## 2013-01-26 NOTE — ED Provider Notes (Addendum)
History     CSN: [id number]  Arrival date & time 01/26/13  1228   First MD Initiated Contact with Patient 01/26/13 1242      Chief Complaint  Patient presents with  . Chest Pain    (Consider location/radiation/quality/duration/timing/severity/associated sxs/prior treatment) HPI Comments: Patient presents to urgent care this afternoon describing that early this morning she woke up with sudden left precordial chest pain ( sharp and pressure) associated with shortness of breath, and nausea. She goes into describing for the last 4 weeks she's been sick with influenza-like illness, was diagnosed by her PCP with bronchitis  took a course of azithromycin last week. After that patient felt better. She has had on and off episodes of diarrhea his she describes the course of the month. The last 2 days she reports she's constipated. She was doing better except that this morning she woke up with this pain. Patient denies any abdominal pain or vomitting.  Patient describes that she's feeling very weak to the point that she can't walk without feeling dizzy all day she's got past out.   Patient is a 47 y.o. female presenting with chest pain. The history is provided by the spouse and the patient.  Chest Pain The chest pain began 3 - 5 hours ago. Chest pain occurs constantly. The chest pain is unchanged. At its most intense, the pain is at 8/10. The quality of the pain is described as pressure-like and sharp. The pain radiates to the left shoulder and left arm. Primary symptoms include nausea and dizziness. Pertinent negatives for primary symptoms include no fever, no fatigue, no syncope and no vomiting.  Dizziness also occurs with nausea. Dizziness does not occur with vomiting.   Her past medical history is significant for anxiety/panic attacks.     Past Medical History  Diagnosis Date  . Bronchitis     Past Surgical History  Procedure Date  . Cholecystectomy   . Toe surgery     Family History    Problem Relation Age of Onset  . Cancer Maternal Grandmother     breast  . Cancer Paternal Grandmother     breast  . Thrombocytopenia Mother   . Heart disease Father 14    CABG  . Thrombocytopenia Brother   . Mental illness Brother     History  Substance Use Topics  . Smoking status: Former Games developer  . Smokeless tobacco: Not on file  . Alcohol Use: No    OB History    Grav Para Term Preterm Abortions TAB SAB Ect Mult Living                  Review of Systems  Constitutional: Negative for fever and fatigue.  Cardiovascular: Positive for chest pain. Negative for syncope.  Gastrointestinal: Positive for nausea. Negative for vomiting.  Neurological: Positive for dizziness.    Allergies  Minocycline hcl and Oxycodone  Home Medications   Current Outpatient Rx  Name  Route  Sig  Dispense  Refill  . OMEPRAZOLE 20 MG PO CPDR   Oral   Take 20 mg by mouth daily.         . AZITHROMYCIN 250 MG PO TABS      2 tab po x 1 day , 1 tab po daily   6 tablet   0     BP 122/59  Pulse 89  Temp 99.5 F (37.5 C) (Oral)  Resp 20  SpO2 99%  Physical Exam  Nursing note and vitals reviewed.  Constitutional:  Non-toxic appearance. She does not have a sickly appearance. She appears ill. No distress.  HENT:  Head: Normocephalic.  Mouth/Throat: No oropharyngeal exudate.  Eyes: Pupils are equal, round, and reactive to light. No scleral icterus.  Neck: Neck supple. No JVD present.  Cardiovascular: Normal rate.  Exam reveals no gallop and no friction rub.   No murmur heard. Pulmonary/Chest: Effort normal and breath sounds normal. No respiratory distress. She has no wheezes. She has no rales. She exhibits no tenderness.  Abdominal: Soft. There is no tenderness.  Skin: No erythema.    ED Course  Procedures (including critical care time)  Labs Reviewed  POCT I-STAT, CHEM 8 - Abnormal; Notable for the following:    Potassium 2.9 (*)     All other components within normal  limits  CBC WITH DIFFERENTIAL   Dg Chest 2 View  01/26/2013  *RADIOLOGY REPORT*  Clinical Data: Left-sided chest pain.  CHEST - 2 VIEW  Comparison: No priors.  Findings: Lung volumes are normal.  No consolidative airspace disease.  No pleural effusions.  No pneumothorax.  No pulmonary nodule or mass noted.  Pulmonary vasculature and the cardiomediastinal silhouette are within normal limits.  IMPRESSION: 1. No radiographic evidence of acute cardiopulmonary disease.   Original Report Authenticated By: Trudie Reed, M.D.      1. Chest pain at rest   2. Hypokalemia     EKG at approximately 12:43 normal sinus rhythm ventricular rate of 74 beats per minute normal PR and QRS duration no ST or T wave abnormalities to suggest ischemic acute ischemia or.  MDM  Multisymptom Patient- patient reporting ILI Sx's for 4 weeks. Patient presents urgent care this morning complaining of left precordial chest pain with radiation to her left upper extremity of sudden onset this morning associated with shortness of breath and nausea. Initial EKG at urgent care revealed no acute ischemic changes. X-rays were unremarkable, patient continues to complain of CP AT REST - unrelated to breathing or cough (  At rest). Noted also hypokalemic at 2.9, with oral dry mucosa. We'll transfer patient to the emergency department for further evaluation and monitoring.    Jimmie Molly, MD 01/26/13 1351  Jimmie Molly, MD 01/26/13 [phone]

## 2013-01-26 NOTE — ED Notes (Signed)
Pt in room providing urine sample at bedside, pt too unstable to walk to bathroom due to ativan.

## 2013-01-26 NOTE — ED Notes (Signed)
Patient transported to CT 

## 2013-01-26 NOTE — ED Notes (Signed)
Family at bedside. 

## 2013-01-26 NOTE — ED Notes (Signed)
Provided blankets 

## 2013-01-26 NOTE — ED Notes (Signed)
MD at bedside. 

## 2013-01-26 NOTE — ED Notes (Signed)
Chest pain and sob.  Reports flu like symptoms approx a month ago.  Reports she felt better, then worsened.  Patient did see her pcp 2 weeks ago and diagnosed with bronchitis.  Treated with azithromycin.  Since finishing script patient has had nausea and vomiting and diarrhea.  Today things changed again, woke with chest pain, sob today.  Reports center chest pain

## 2013-01-26 NOTE — ED Notes (Signed)
Arm band placed on patient, verified name and birth date.

## 2013-01-26 NOTE — ED Notes (Signed)
Pt able to tolerate PO fluids, EDP Rancour notified.

## 2013-01-28 ENCOUNTER — Ambulatory Visit (INDEPENDENT_AMBULATORY_CARE_PROVIDER_SITE_OTHER): Payer: BC Managed Care – PPO | Admitting: Family Medicine

## 2013-01-28 ENCOUNTER — Encounter: Payer: Self-pay | Admitting: Family Medicine

## 2013-01-28 VITALS — BP 140/80 | HR 66 | Temp 97.9°F | Ht 65.5 in | Wt 129.0 lb

## 2013-01-28 DIAGNOSIS — R197 Diarrhea, unspecified: Secondary | ICD-10-CM

## 2013-01-28 DIAGNOSIS — R112 Nausea with vomiting, unspecified: Secondary | ICD-10-CM | POA: Insufficient documentation

## 2013-01-28 DIAGNOSIS — E876 Hypokalemia: Secondary | ICD-10-CM

## 2013-01-28 DIAGNOSIS — F410 Panic disorder [episodic paroxysmal anxiety] without agoraphobia: Secondary | ICD-10-CM

## 2013-01-28 LAB — POTASSIUM: Potassium: 3.8 mEq/L (ref 3.5–5.3)

## 2013-01-28 MED ORDER — ALPRAZOLAM 0.25 MG PO TABS: 0.2500 mg | ORAL_TABLET | Freq: Every day | ORAL | Status: DC | PRN

## 2013-01-28 MED ORDER — PROMETHAZINE HCL 25 MG PO TABS: 25.0000 mg | ORAL_TABLET | Freq: Three times a day (TID) | ORAL | Status: DC | PRN

## 2013-01-28 NOTE — Progress Notes (Signed)
  Subjective:    Patient ID: Jackie Ford, female    DOB: 02/01/1966, 47 y.o.   MRN: [id number]  HPI  47 year old female presents for hospital follow up. She was seen on 1/29 in ER. She woke up the day of urgent Care visit  with sudden left precordial chest pain ( sharp and pressure) associated with shortness of breath, and nausea.  She had recently recovered from bronchitits.  She had several days of diarrhea and stomach pain after antibiotics.  CXR nml, EKG nml  UA showed ketones.. Dehydrated, no infeciton CE ruled out Upreg and rapid strep neg. Lipase nml CMET nml except She was hypokalemic, 2.9 CT scan abd, pelvis: negative.  Transfered to ER , IVF, repleted K... 3.2 potassium  She was also very agitated but relaxed when given ativan.  Today .Marland Kitchen No further chest pain. No SOB. Still with nausea, and watery stool but not frequent. Poor appetite. No further emesis, just some spitting up saliva. Given zofran for nausea.. But this makes it worse, took once.  Anxiety: scared to take antidepressant. Never saw pshychiatrist, has had bad experiences in past. Had been doing well with mood until lately.  Was on alprazolam for 17 years.    Review of Systems  Constitutional: Negative for fever and fatigue.  HENT: Negative for ear pain.   Eyes: Negative for pain.  Respiratory: Negative for chest tightness and shortness of breath.   Cardiovascular: Negative for chest pain, palpitations and leg swelling.  Gastrointestinal: Negative for abdominal pain.  Genitourinary: Negative for dysuria.       Objective:   Physical Exam  Constitutional: Vital signs are normal. She appears well-developed and well-nourished. She is cooperative.  Non-toxic appearance. She does not appear ill. No distress.  HENT:  Head: Normocephalic.  Right Ear: Hearing, tympanic membrane, external ear and ear canal normal. Tympanic membrane is not erythematous, not retracted and not bulging.  Left Ear: Hearing, tympanic  membrane, external ear and ear canal normal. Tympanic membrane is not erythematous, not retracted and not bulging.  Nose: No mucosal edema or rhinorrhea. Right sinus exhibits no maxillary sinus tenderness and no frontal sinus tenderness. Left sinus exhibits no maxillary sinus tenderness and no frontal sinus tenderness.  Mouth/Throat: Uvula is midline, oropharynx is clear and moist and mucous membranes are normal.  Eyes: Conjunctivae normal, EOM and lids are normal. Pupils are equal, round, and reactive to light. No foreign bodies found.  Neck: Trachea normal and normal range of motion. Neck supple. Carotid bruit is not present. No mass and no thyromegaly present.  Cardiovascular: Normal rate, regular rhythm, S1 normal, S2 normal, normal heart sounds, intact distal pulses and normal pulses.  Exam reveals no gallop and no friction rub.   No murmur heard. Pulmonary/Chest: Effort normal and breath sounds normal. Not tachypneic. No respiratory distress. She has no decreased breath sounds. She has no wheezes. She has no rhonchi. She has no rales.  Abdominal: Soft. Normal appearance and bowel sounds are normal. There is no hepatosplenomegaly. There is generalized tenderness. There is no rebound and no CVA tenderness. No hernia.  Neurological: She is alert.  Skin: Skin is warm, dry and intact. No rash noted.  Psychiatric: Her speech is normal and behavior is normal. Judgment and thought content normal. Her mood appears not anxious. Cognition and memory are normal. She does not exhibit a depressed mood.          Assessment & Plan:

## 2013-01-28 NOTE — Patient Instructions (Addendum)
Can use phenergan for nausea.  Push fluids.. Gatorade.  Get back to regular diet as soon as able, will help diarrhea resolve. We will call you with potassium results. Start alprazolam as needed for anxiety. Call us next week with an update on how you feel.

## 2013-01-28 NOTE — Assessment & Plan Note (Signed)
?   Secondary to antibiotic vs viral GE. Can try phenergan for nausea, push fluids, time and rest.  Pt had full eval in ER.

## 2013-01-28 NOTE — Assessment & Plan Note (Signed)
Likely due to diarrhea and poor intake.  Will eval potassium today.. May need additional supplement.

## 2013-01-28 NOTE — Assessment & Plan Note (Signed)
Poor control. Will start alprazolam prn. If needing regularly.. Will again recommend referral to pshychiatry and or consideration of restarting antidepressant like sertraine.

## 2013-02-12 ENCOUNTER — Other Ambulatory Visit: Payer: Self-pay

## 2013-10-04 ENCOUNTER — Encounter: Payer: Self-pay | Admitting: Family Medicine

## 2013-10-04 ENCOUNTER — Ambulatory Visit (INDEPENDENT_AMBULATORY_CARE_PROVIDER_SITE_OTHER): Payer: BC Managed Care – PPO | Admitting: Family Medicine

## 2013-10-04 VITALS — BP 128/82 | HR 69 | Temp 98.3°F | Ht 65.5 in | Wt 125.8 lb

## 2013-10-04 DIAGNOSIS — R1011 Right upper quadrant pain: Secondary | ICD-10-CM

## 2013-10-04 DIAGNOSIS — N921 Excessive and frequent menstruation with irregular cycle: Secondary | ICD-10-CM | POA: Insufficient documentation

## 2013-10-04 DIAGNOSIS — N920 Excessive and frequent menstruation with regular cycle: Secondary | ICD-10-CM

## 2013-10-04 NOTE — Progress Notes (Signed)
  Subjective:    Patient ID: Jackie Ford, female    DOB: 1966-03-25, 47 y.o.   MRN: [id number]  HPI 47 year old female presents with new onset menorrhagia  in last 3 weeks. She has continuous menses for past 3 weeks, sometimes lighter. She is using tampons every 4-6 hours at heaviest.  She has had some issues putting in tampon 2 weeks ago... Had to use shower cleaning tooth brush to come out !  She has had some abdominal tenderness in last few day. No lower abdominal pain.  She has been having some fatigue and headache.  No history of heavy bleeding . She has fairly irregular menses typically.  No fever.  No on aspirin. Using ibuprofen for headache in last few days. Mother menopause agge 35-50.    Review of Systems  Constitutional: Positive for fatigue.  Gastrointestinal: Positive for nausea.  Genitourinary: Positive for vaginal bleeding. Negative for dysuria, vaginal discharge and pelvic pain.  Neurological: Positive for headaches.       Objective:   Physical Exam  Constitutional: Vital signs are normal. She appears well-developed and well-nourished. She is cooperative.  Non-toxic appearance. She does not appear ill. No distress.  HENT:  Head: Normocephalic.  Right Ear: Hearing, tympanic membrane, external ear and ear canal normal. Tympanic membrane is not erythematous, not retracted and not bulging.  Left Ear: Hearing, tympanic membrane, external ear and ear canal normal. Tympanic membrane is not erythematous, not retracted and not bulging.  Nose: No mucosal edema or rhinorrhea. Right sinus exhibits no maxillary sinus tenderness and no frontal sinus tenderness. Left sinus exhibits no maxillary sinus tenderness and no frontal sinus tenderness.  Mouth/Throat: Uvula is midline, oropharynx is clear and moist and mucous membranes are normal.  Eyes: Conjunctivae, EOM and lids are normal. Pupils are equal, round, and reactive to light. Lids are everted and swept, no foreign bodies  found.  Neck: Trachea normal and normal range of motion. Neck supple. Carotid bruit is not present. No mass and no thyromegaly present.  Cardiovascular: Normal rate, regular rhythm, S1 normal, S2 normal, normal heart sounds, intact distal pulses and normal pulses.  Exam reveals no gallop and no friction rub.   No murmur heard. Pulmonary/Chest: Effort normal and breath sounds normal. Not tachypneic. No respiratory distress. She has no decreased breath sounds. She has no wheezes. She has no rhonchi. She has no rales.  Abdominal: Soft. Normal appearance and bowel sounds are normal. There is tenderness in the right upper quadrant.  Neurological: She is alert.  Skin: Skin is warm, dry and intact. No rash noted.  Psychiatric: Her speech is normal and behavior is normal. Judgment and thought content normal. Her mood appears not anxious. Cognition and memory are normal. She does not exhibit a depressed mood.          Assessment & Plan:

## 2013-10-04 NOTE — Patient Instructions (Addendum)
We will call with labs and stop at front for abd ultrasound. If nausea and upper abdomen symptoms not improving... restart prilosec.  Stop ibuprofen use tylenol for headache.

## 2013-10-04 NOTE — Assessment & Plan Note (Signed)
With nausea.  S/P cholecystectomy.  Restart prilosec. Call if not improving.

## 2013-10-04 NOTE — Assessment & Plan Note (Signed)
No clear sign of infection.  Eval with labs and [place].

## 2013-10-05 LAB — PROTIME-INR
INR: 1.2 ratio — ABNORMAL HIGH (ref 0.8–1.0)
Prothrombin Time: 13.1 s — ABNORMAL HIGH (ref 10.2–12.4)

## 2013-10-05 LAB — CBC WITH DIFFERENTIAL/PLATELET
Basophils Absolute: 0.1 10*3/uL (ref 0.0–0.1)
Basophils Relative: 0.7 % (ref 0.0–3.0)
Eosinophils Absolute: 0.1 10*3/uL (ref 0.0–0.7)
Eosinophils Relative: 1.4 % (ref 0.0–5.0)
HCT: 43.6 % (ref 36.0–46.0)
Lymphocytes Relative: 37.7 % (ref 12.0–46.0)
MCV: 86 fl (ref 78.0–100.0)
Monocytes Absolute: 0.4 10*3/uL (ref 0.1–1.0)
Neutrophils Relative %: 54.1 % (ref 43.0–77.0)
Platelets: 263 10*3/uL (ref 150.0–400.0)
RBC: 5.07 Mil/uL (ref 3.87–5.11)
RDW: 13.3 % (ref 11.5–14.6)
WBC: 7.1 10*3/uL (ref 4.5–10.5)

## 2013-10-06 LAB — FOLLICLE STIMULATING HORMONE: FSH: 50.6 m[IU]/mL

## 2013-10-07 ENCOUNTER — Other Ambulatory Visit: Payer: BC Managed Care – PPO

## 2013-10-11 ENCOUNTER — Ambulatory Visit: Payer: Self-pay | Admitting: Family Medicine

## 2013-10-12 ENCOUNTER — Encounter: Payer: Self-pay | Admitting: Family Medicine

## 2013-10-14 ENCOUNTER — Telehealth: Payer: Self-pay | Admitting: Family Medicine

## 2013-10-14 DIAGNOSIS — N921 Excessive and frequent menstruation with irregular cycle: Secondary | ICD-10-CM

## 2013-10-14 NOTE — Telephone Encounter (Signed)
Message copied by Excell Seltzer on Fri Oct 14, 2013  5:26 PM ------      Message from: Damita Lack      Created: Thu Oct 13, 2013  5:25 PM       Patient notified as instructed by telephone.  States she stopped bleeding this past Saturday.  Would like to move forward with the GYN referral.      Asking for a female provider.  Will forward to Dr. Ermalene Searing to order referral. ------

## 2013-10-24 ENCOUNTER — Encounter: Payer: Self-pay | Admitting: Family Medicine

## 2013-11-03 ENCOUNTER — Other Ambulatory Visit: Payer: Self-pay

## 2014-04-27 ENCOUNTER — Telehealth: Payer: Self-pay | Admitting: Family Medicine

## 2014-04-27 DIAGNOSIS — Z13220 Encounter for screening for lipoid disorders: Secondary | ICD-10-CM

## 2014-04-27 NOTE — Telephone Encounter (Signed)
Message copied by Jinny Sanders on Thu Apr 27, 2014  8:34 AM ------      Message from: Ellamae Sia      Created: Tue Apr 18, 2014  4:16 PM      Regarding: Lab orders for Friday,5.1.15       Patient is scheduled for CPX labs, please order future labs, Thanks , Terri       ------

## 2014-04-28 ENCOUNTER — Other Ambulatory Visit: Payer: BC Managed Care – PPO

## 2014-05-01 ENCOUNTER — Other Ambulatory Visit (INDEPENDENT_AMBULATORY_CARE_PROVIDER_SITE_OTHER): Payer: BC Managed Care – PPO

## 2014-05-01 DIAGNOSIS — Z13220 Encounter for screening for lipoid disorders: Secondary | ICD-10-CM

## 2014-05-01 LAB — COMPREHENSIVE METABOLIC PANEL
ALT: 16 U/L (ref 0–35)
AST: 24 U/L (ref 0–37)
Albumin: 4 g/dL (ref 3.5–5.2)
Alkaline Phosphatase: 45 U/L (ref 39–117)
BUN: 5 mg/dL — ABNORMAL LOW (ref 6–23)
CALCIUM: 9.2 mg/dL (ref 8.4–10.5)
CHLORIDE: 101 meq/L (ref 96–112)
CO2: 30 meq/L (ref 19–32)
CREATININE: 0.6 mg/dL (ref 0.4–1.2)
GFR: 110.41 mL/min (ref >60.00)
GLUCOSE: 89 mg/dL (ref 70–99)
Potassium: 3.9 mEq/L (ref 3.5–5.1)
Sodium: 137 mEq/L (ref 135–145)
TOTAL PROTEIN: 6.6 g/dL (ref 6.0–8.3)
Total Bilirubin: 0.6 mg/dL (ref 0.3–1.2)

## 2014-05-01 LAB — LIPID PANEL
Cholesterol: 166 mg/dL (ref 0–200)
HDL: 50.2 mg/dL (ref >39.00)
LDL Cholesterol: 97 mg/dL (ref 0–99)
Total CHOL/HDL Ratio: 3
Triglycerides: 94 mg/dL (ref 0.0–149.0)
VLDL: 18.8 mg/dL (ref 0.0–40.0)

## 2014-05-05 ENCOUNTER — Encounter: Payer: Self-pay | Admitting: Family Medicine

## 2014-05-05 ENCOUNTER — Ambulatory Visit (INDEPENDENT_AMBULATORY_CARE_PROVIDER_SITE_OTHER): Payer: BC Managed Care – PPO | Admitting: Family Medicine

## 2014-05-05 ENCOUNTER — Other Ambulatory Visit (HOSPITAL_COMMUNITY)
Admission: RE | Admit: 2014-05-05 | Discharge: 2014-05-05 | Disposition: A | Discharge status: Home / Self Care | Payer: BC Managed Care – PPO | Source: Ambulatory Visit | Attending: Family Medicine | Admitting: Family Medicine

## 2014-05-05 VITALS — BP 110/78 | HR 71 | Temp 98.1°F | Ht 64.0 in | Wt 133.5 lb

## 2014-05-05 DIAGNOSIS — IMO0001 Reserved for inherently not codable concepts without codable children: Secondary | ICD-10-CM

## 2014-05-05 DIAGNOSIS — D259 Leiomyoma of uterus, unspecified: Secondary | ICD-10-CM | POA: Insufficient documentation

## 2014-05-05 DIAGNOSIS — Z01419 Encounter for gynecological examination (general) (routine) without abnormal findings: Secondary | ICD-10-CM | POA: Insufficient documentation

## 2014-05-05 DIAGNOSIS — F410 Panic disorder [episodic paroxysmal anxiety] without agoraphobia: Secondary | ICD-10-CM

## 2014-05-05 DIAGNOSIS — Z0000 Encounter for general adult medical examination without abnormal findings: Secondary | ICD-10-CM

## 2014-05-05 DIAGNOSIS — M797 Fibromyalgia: Secondary | ICD-10-CM

## 2014-05-05 DIAGNOSIS — Z124 Encounter for screening for malignant neoplasm of cervix: Secondary | ICD-10-CM

## 2014-05-05 NOTE — Assessment & Plan Note (Signed)
Moderate control if manages anxiety and avoids specific clothes/ shoes.

## 2014-05-05 NOTE — Progress Notes (Signed)
The patient is here for annual wellness exam and preventative care.   Panic attacks , agoraphbia: Continued issues with this. She is using alprazolam as needed, uses rarely.  Fibromyalgia and right sided body pain: tolerable lately.  09/2013: menorrhagia : Nml bleeding factors. No anemia, nml platelets. Not clearly in menopause. US uterus showed:  Fibroid. She was referred to GYN. No further rec made.   She reports some irregular menses, no heavy bleeding.  Reviewed labs in detail with pt. Lab Results  Component Value Date   CHOL 166 05/01/2014   HDL 50.20 05/01/2014   LDLCALC 97 05/01/2014   TRIG 94.0 05/01/2014   CHOLHDL 3 05/01/2014   BP Readings from Last 3 Encounters:  05/05/14 110/78  10/04/13 128/82  01/28/13 140/80   Wt Readings from Last 3 Encounters:  05/05/14 133 lb 8 oz (60.555 kg)  10/04/13 125 lb 12 oz (57.04 kg)  01/28/13 129 lb (58.514 kg)    Walking occ, she is trying to do ALS walk, etc.  Review of Systems  Constitutional: Negative for fever, fatigue and unexpected weight change.  HENT: Negative for ear pain, congestion, sore throat, sneezing, trouble swallowing and sinus pressure.  Eyes: Negative for pain and itching.  Respiratory: Negative for cough, shortness of breath and wheezing.  Cardiovascular: Negative for chest pain, palpitations and leg swelling.  Gastrointestinal: Negative for nausea, abdominal pain, diarrhea, constipation and blood in stool.  Genitourinary: Negative for dysuria, hematuria, vaginal discharge, difficulty urinating and menstrual problem.  Musculoskeletal:  Pain to palpation on right side of body.  Skin: Negative for rash.  Neurological: Negative for syncope, weakness, light-headedness, numbness and headaches.  Psychiatric/Behavioral: Negative for confusion and dysphoric mood. The patient is not nervous/anxious.  Objective:   Physical Exam  Constitutional: Vital signs are normal. She appears well-developed and well-nourished. She is  cooperative. Non-toxic appearance. She does not appear ill. No distress.  HENT:  Head: Normocephalic.  Right Ear: Hearing, tympanic membrane, external ear and ear canal normal.  Left Ear: Hearing, tympanic membrane, external ear and ear canal normal.  Nose: Nose normal.  Eyes: Conjunctivae, EOM and lids are normal. Pupils are equal, round, and reactive to light. No foreign bodies found.  Neck: Trachea normal and normal range of motion. Neck supple. Carotid bruit is not present. No mass and no thyromegaly present.  Cardiovascular: Normal rate, regular rhythm, S1 normal, S2 normal, normal heart sounds and intact distal pulses. Exam reveals no gallop.  No murmur heard.  Pulmonary/Chest: Effort normal and breath sounds normal. No respiratory distress. She has no wheezes. She has no rhonchi. She has no rales.  Abdominal: Soft. Normal appearance and bowel sounds are normal. She exhibits no distension, no fluid wave, no abdominal bruit and no mass. There is no hepatosplenomegaly. There is no tenderness. There is no rebound, no guarding and no CVA tenderness. No hernia.  Genitourinary: Vagina normal and uterus normal. No breast swelling, tenderness, discharge or bleeding. Pelvic exam was performed with patient prone. There is no rash, tenderness or lesion on the right labia. There is no rash, tenderness or lesion on the left labia. Uterus is not enlarged and not tender. Cervix exhibits no discharge and no friability. Right adnexum displays no mass, no tenderness and no fullness. Left adnexum displays no mass, no tenderness and no fullness.  Lymphadenopathy:  She has no cervical adenopathy.  She has no axillary adenopathy.  Neurological: She is alert. She has normal strength. No cranial nerve deficit or sensory deficit.  Skin: Skin is warm, dry and intact. No rash noted.  Psychiatric: Her speech is normal and behavior is normal. Judgment normal. Her mood appears not anxious. Cognition and memory are normal.  She does not exhibit a depressed mood.  Assessment & Plan:   Complete Physical Exam: The patient's preventative maintenance and recommended screening tests for an annual wellness exam were reviewed in full today.  Brought up to date unless services declined.  Counselled on the importance of diet, exercise, and its role in overall health and mortality.  The patient's FH and SH was reviewed, including their home life, tobacco status, and drug and alcohol status.   Vaccines: Uptodate with Tdap Mammo: nml 2013 DVE/PAP: nml  Pap 2012, plan repeat q 3 years... Due this year. Colon: father dx age 46 with colon cancer.

## 2014-05-05 NOTE — Patient Instructions (Signed)
Work on increasing exercise, goal 3-5 times daily.  Call to schedule mammogram on your own.Jackie Ford.

## 2014-05-05 NOTE — Assessment & Plan Note (Signed)
Stable with rare alprazolam

## 2014-05-05 NOTE — Progress Notes (Signed)
Pre visit review using our clinic review tool, if applicable. No additional management support is needed unless otherwise documented below in the visit note. 

## 2014-05-09 ENCOUNTER — Encounter: Payer: Self-pay | Admitting: *Deleted

## 2014-09-15 ENCOUNTER — Telehealth: Payer: Self-pay

## 2014-09-15 DIAGNOSIS — N6019 Diffuse cystic mastopathy of unspecified breast: Secondary | ICD-10-CM

## 2014-09-15 NOTE — Telephone Encounter (Signed)
Jackie Ford left v/m requesting referral to [facility]; [place] [facility] for diagnostic mammogram. Jackie Ford request cb.

## 2014-09-18 ENCOUNTER — Other Ambulatory Visit: Payer: Self-pay | Admitting: Family Medicine

## 2014-09-18 DIAGNOSIS — Z1231 Encounter for screening mammogram for malignant neoplasm of breast: Secondary | ICD-10-CM

## 2014-09-29 ENCOUNTER — Ambulatory Visit: Payer: BC Managed Care – PPO

## 2014-10-06 ENCOUNTER — Ambulatory Visit
Admission: RE | Admit: 2014-10-06 | Discharge: 2014-10-06 | Disposition: A | Discharge status: Home / Self Care | Payer: BC Managed Care – PPO | Source: Ambulatory Visit | Attending: Family Medicine | Admitting: Family Medicine

## 2014-10-06 DIAGNOSIS — Z1231 Encounter for screening mammogram for malignant neoplasm of breast: Secondary | ICD-10-CM

## 2014-10-17 ENCOUNTER — Telehealth: Payer: Self-pay

## 2014-10-17 NOTE — Telephone Encounter (Signed)
Jackie Ford notified by telephone that her mammogram was normal.

## 2014-10-17 NOTE — Telephone Encounter (Signed)
Jackie Ford left v/m requesting recent mammogram report.Please advise.

## 2014-11-06 ENCOUNTER — Telehealth: Payer: Self-pay

## 2014-11-06 MED ORDER — ZOLOFT 25 MG PO TABS: 25.0000 mg | ORAL_TABLET | Freq: Every day | ORAL | Status: DC

## 2014-11-06 NOTE — Telephone Encounter (Signed)
Mr Gorr called back to request when zoloft is prescribed to use name brand only; generic does not work.

## 2014-11-06 NOTE — Telephone Encounter (Signed)
Mr Polizzi said pt recently said if she had the opportunity to start zoloft again she would take the med this time. Pt was offered zoloft 2012 but did not want to take at that time. Mr Madarang said pt is not really depressed she is the same as she always is but seems to be open to taking medicine now. Mr Kisner requested appt 11/10/14. Pt scheduled 11/10/14 at 3:30. No SI/HI. Mr Mcinerney will cb if pt condition changes or worsens.

## 2014-11-06 NOTE — Telephone Encounter (Signed)
We will send in rx for low dose name brand zoloft.  Have pt make appt for eval of mood 1 month after starting the med.

## 2014-11-07 NOTE — Telephone Encounter (Signed)
Yes cancel that appt.

## 2014-11-07 NOTE — Telephone Encounter (Signed)
Patient is scheduled to see you this Friday 11/10/2014.  Do you want me to cancel that appointment?

## 2014-11-07 NOTE — Telephone Encounter (Signed)
Jackie Ford notified prescription for Zoloft 25 mg has been sent to their pharmacy.  Advised Dr. Diona Browner would like to see Jackie Ford one month after starting the Zoloft.  Appointment rescheduled from 11/10/2014 to 12/08/2014 @ 3:00pm.

## 2014-11-10 ENCOUNTER — Ambulatory Visit: Payer: BC Managed Care – PPO | Admitting: Family Medicine

## 2014-12-08 ENCOUNTER — Ambulatory Visit (INDEPENDENT_AMBULATORY_CARE_PROVIDER_SITE_OTHER): Payer: BC Managed Care – PPO | Admitting: Family Medicine

## 2014-12-08 ENCOUNTER — Encounter: Payer: Self-pay | Admitting: Family Medicine

## 2014-12-08 VITALS — BP 114/72 | HR 64 | Temp 98.4°F | Ht 64.0 in | Wt 132.5 lb

## 2014-12-08 DIAGNOSIS — F329 Major depressive disorder, single episode, unspecified: Secondary | ICD-10-CM

## 2014-12-08 DIAGNOSIS — F429 Obsessive-compulsive disorder, unspecified: Secondary | ICD-10-CM | POA: Insufficient documentation

## 2014-12-08 DIAGNOSIS — F32A Depression, unspecified: Secondary | ICD-10-CM

## 2014-12-08 DIAGNOSIS — F42 Obsessive-compulsive disorder: Secondary | ICD-10-CM

## 2014-12-08 DIAGNOSIS — F4000 Agoraphobia, unspecified: Secondary | ICD-10-CM

## 2014-12-08 DIAGNOSIS — M797 Fibromyalgia: Secondary | ICD-10-CM

## 2014-12-08 DIAGNOSIS — L259 Unspecified contact dermatitis, unspecified cause: Secondary | ICD-10-CM | POA: Insufficient documentation

## 2014-12-08 DIAGNOSIS — F410 Panic disorder [episodic paroxysmal anxiety] without agoraphobia: Secondary | ICD-10-CM

## 2014-12-08 MED ORDER — CYCLOBENZAPRINE HCL 5 MG PO TABS: 5.0000 mg | ORAL_TABLET | Freq: Three times a day (TID) | ORAL | Status: DC | PRN

## 2014-12-08 NOTE — Progress Notes (Signed)
   Subjective:    Patient ID: Jackie Ford, female    DOB: August 11, 1966, 48 y.o.   MRN: [id number]  HPI 48 year old female with obsessive thoughts, agoraphobia, anxiety and panic attacks presents for  1 month follow up zoloft. She started this after discussion with her husband on 11/28/2014.   She reports that initially she missed a dose and she felt ill. She has no further issues with it. She reports that she has had significant improvement with anxious thoughts, analyzing thought, obsessing. She is sleeping well at night.  She has noted a rash on her chest, itchy. IMproving some now . No new exposures to new lotions, new detergent, new foods.  She has pain off and on in right side ongoing for years, seems to be worse per husband when she is upset.: She has tried advil and  tyelnol for pain. SE to oxycodone in past. Hx of hypertonicity ion right side. She has never tried a muscle relaxant.   Review of Systems  Constitutional: Negative for fever and fatigue.  HENT: Negative for ear pain.   Eyes: Negative for pain.  Respiratory: Negative for chest tightness and shortness of breath.   Cardiovascular: Negative for chest pain, palpitations and leg swelling.  Gastrointestinal: Negative for abdominal pain.  Genitourinary: Negative for dysuria.       Objective:   Physical Exam  Constitutional: Vital signs are normal. She appears well-developed and well-nourished. She is cooperative.  Non-toxic appearance. She does not appear ill. No distress.  HENT:  Head: Normocephalic.  Right Ear: Hearing, tympanic membrane, external ear and ear canal normal. Tympanic membrane is not erythematous, not retracted and not bulging.  Left Ear: Hearing, tympanic membrane, external ear and ear canal normal. Tympanic membrane is not erythematous, not retracted and not bulging.  Nose: No mucosal edema or rhinorrhea. Right sinus exhibits no maxillary sinus tenderness and no frontal sinus tenderness. Left sinus exhibits  no maxillary sinus tenderness and no frontal sinus tenderness.  Mouth/Throat: Uvula is midline, oropharynx is clear and moist and mucous membranes are normal.  Eyes: Conjunctivae, EOM and lids are normal. Pupils are equal, round, and reactive to light. Lids are everted and swept, no foreign bodies found.  Neck: Trachea normal and normal range of motion. Neck supple. Carotid bruit is not present. No thyroid mass and no thyromegaly present.  Cardiovascular: Normal rate, regular rhythm, S1 normal, S2 normal, normal heart sounds, intact distal pulses and normal pulses.  Exam reveals no gallop and no friction rub.   No murmur heard. Pulmonary/Chest: Effort normal and breath sounds normal. No tachypnea. No respiratory distress. She has no decreased breath sounds. She has no wheezes. She has no rhonchi. She has no rales.  Abdominal: Soft. Normal appearance and bowel sounds are normal. There is no tenderness.  Neurological: She is alert.  Skin: Skin is warm, dry and intact. Rash noted.     Small erythematous nonraised patches on chest, pruritic.  Psychiatric: Her speech is normal and behavior is normal. Judgment and thought content normal. Her mood appears not anxious. Cognition and memory are normal. She does not exhibit a depressed mood.           Assessment & Plan:

## 2014-12-08 NOTE — Progress Notes (Signed)
Pre visit review using our clinic review tool, if applicable. No additional management support is needed unless otherwise documented below in the visit note. 

## 2014-12-08 NOTE — Assessment & Plan Note (Signed)
Will try muscle relaxant for pain/ spasm in right side of body.

## 2014-12-08 NOTE — Patient Instructions (Addendum)
Can apply topical cortisone cream to rash. Continue zoloft 25 mg daily.  Call if feel interested in increasing thew zoloft further. Keep follow up as scheduled for CPX 04/2015 Cyclobenzaprine 5 mg as needed for right sided body pain and relaxant.

## 2014-12-08 NOTE — Assessment & Plan Note (Signed)
Not clearly related to zoloft. ? Insect bite.. Treat with topical hydrocortisone OTC.

## 2014-12-08 NOTE — Assessment & Plan Note (Signed)
Improved significantly on zoloft, may need to increase

## 2015-01-08 ENCOUNTER — Other Ambulatory Visit: Payer: Self-pay | Admitting: *Deleted

## 2015-01-08 NOTE — Telephone Encounter (Signed)
Encounter opened in error

## 2015-04-12 ENCOUNTER — Telehealth: Payer: Self-pay | Admitting: *Deleted

## 2015-04-12 ENCOUNTER — Telehealth: Payer: Self-pay

## 2015-04-12 MED ORDER — ZOLOFT 25 MG PO TABS: 25.0000 mg | ORAL_TABLET | Freq: Every day | ORAL | Status: DC

## 2015-04-12 NOTE — Telephone Encounter (Signed)
Please call and scheduled CPE with fasting labs prior for sometime after May 8 with Dr. Diona Browner.

## 2015-04-12 NOTE — Telephone Encounter (Signed)
Note was left requesting refill zoloft to [facility]; spoke with Jenny Reichmann at [facility] and pt has available refill but to early to fill; pt last got med on 03/28/15. Left v/m requesting cb.

## 2015-04-20 NOTE — Telephone Encounter (Signed)
See 04/12/15 phone note; refill done.

## 2015-05-25 ENCOUNTER — Ambulatory Visit (INDEPENDENT_AMBULATORY_CARE_PROVIDER_SITE_OTHER): Payer: 59 | Admitting: Family Medicine

## 2015-05-25 ENCOUNTER — Encounter: Payer: Self-pay | Admitting: Family Medicine

## 2015-05-25 VITALS — BP 114/80 | HR 53 | Temp 97.6°F | Ht 64.0 in | Wt 120.2 lb

## 2015-05-25 DIAGNOSIS — M5442 Lumbago with sciatica, left side: Secondary | ICD-10-CM | POA: Diagnosis not present

## 2015-05-25 MED ORDER — ZOLOFT 25 MG PO TABS: 25.0000 mg | ORAL_TABLET | Freq: Every day | ORAL | Status: DC

## 2015-05-25 MED ORDER — CYCLOBENZAPRINE HCL 5 MG PO TABS: 5.0000 mg | ORAL_TABLET | Freq: Three times a day (TID) | ORAL | Status: DC | PRN

## 2015-05-25 NOTE — Assessment & Plan Note (Signed)
Treat with NSAIDs ( pt refuses rx), heat and start home pt. No red flags.

## 2015-05-25 NOTE — Progress Notes (Signed)
Pre visit review using our clinic review tool, if applicable. No additional management support is needed unless otherwise documented below in the visit note. 

## 2015-05-25 NOTE — Progress Notes (Signed)
   Subjective:    Patient ID: Jackie Ford, female    DOB: 09/27/1966, 49 y.o.   MRN: [id number]  HPI   49 year old female  with fibromyalgia presents with new onset left leg pain. She has been exercising with walking in last few months. In last 2 months she noted intermittent initially now constant pain in left buttock, radiates to left foot on sole. Worse with sitting, better with lying on opposite side. No numbness or weakness in left leg.  She has noted more varicose veins on left calf and thigh. No low back pain.  No fever. No incontinence. Occ notes left foot swelling intermittent after standing on feet a lot.  Has not tried any med at home.       Review of Systems  Constitutional: Negative for fever and fatigue.  HENT: Negative for ear pain.   Eyes: Negative for pain.  Respiratory: Negative for chest tightness and shortness of breath.   Cardiovascular: Negative for chest pain, palpitations and leg swelling.  Gastrointestinal: Negative for abdominal pain.  Genitourinary: Negative for dysuria.       Objective:   Physical Exam  Constitutional: Vital signs are normal. She appears well-developed and well-nourished. She is cooperative.  Non-toxic appearance. She does not appear ill. No distress.  HENT:  Head: Normocephalic.  Right Ear: Hearing, tympanic membrane, external ear and ear canal normal. Tympanic membrane is not erythematous, not retracted and not bulging.  Left Ear: Hearing, tympanic membrane, external ear and ear canal normal. Tympanic membrane is not erythematous, not retracted and not bulging.  Nose: No mucosal edema or rhinorrhea. Right sinus exhibits no maxillary sinus tenderness and no frontal sinus tenderness. Left sinus exhibits no maxillary sinus tenderness and no frontal sinus tenderness.  Mouth/Throat: Uvula is midline, oropharynx is clear and moist and mucous membranes are normal.  Eyes: Conjunctivae, EOM and lids are normal. Pupils are equal, round,  and reactive to light. Lids are everted and swept, no foreign bodies found.  Neck: Trachea normal and normal range of motion. Neck supple. Carotid bruit is not present. No thyroid mass and no thyromegaly present.  Cardiovascular: Normal rate, regular rhythm, S1 normal, S2 normal, normal heart sounds, intact distal pulses and normal pulses.  Exam reveals no gallop and no friction rub.   No murmur heard. Pulmonary/Chest: Effort normal and breath sounds normal. No tachypnea. No respiratory distress. She has no decreased breath sounds. She has no wheezes. She has no rhonchi. She has no rales.  Abdominal: Soft. Normal appearance and bowel sounds are normal. There is no tenderness.  Musculoskeletal:       Lumbar back: She exhibits decreased range of motion and tenderness. She exhibits no bony tenderness.  Positive left SLR, neg faber's  Neurological: She is alert. She has normal strength. No cranial nerve deficit or sensory deficit. She displays a negative Romberg sign. Gait normal.  Skin: Skin is warm, dry and intact. No rash noted.  Psychiatric: Her speech is normal and behavior is normal. Judgment and thought content normal. Her mood appears not anxious. Cognition and memory are normal. She does not exhibit a depressed mood.  Nursing note reviewed.         Assessment & Plan:

## 2015-05-25 NOTE — Patient Instructions (Signed)
Start home stretching exercises. Heat on low back.  Start ibuprofen 800 mg  Every 8 hours for pain and inflammation.  Call if not improvoing  In 2 weeks.

## 2015-06-02 ENCOUNTER — Other Ambulatory Visit: Payer: Self-pay | Admitting: Family Medicine

## 2015-09-28 ENCOUNTER — Ambulatory Visit (INDEPENDENT_AMBULATORY_CARE_PROVIDER_SITE_OTHER): Payer: 59 | Admitting: Family Medicine

## 2015-09-28 ENCOUNTER — Encounter: Payer: Self-pay | Admitting: Family Medicine

## 2015-09-28 VITALS — BP 110/70 | HR 68 | Temp 97.7°F | Wt 116.0 lb

## 2015-09-28 DIAGNOSIS — M7062 Trochanteric bursitis, left hip: Secondary | ICD-10-CM | POA: Diagnosis not present

## 2015-09-28 MED ORDER — DICLOFENAC SODIUM 75 MG PO TBEC: 75.0000 mg | DELAYED_RELEASE_TABLET | Freq: Two times a day (BID) | ORAL | Status: DC

## 2015-09-28 NOTE — Assessment & Plan Note (Addendum)
Start home PT, info given. Use diclofenac for inflammation and pain. If not improving in 2 weeks consider steroid injection.

## 2015-09-28 NOTE — Progress Notes (Signed)
   Subjective:    Patient ID: Jackie Ford, female    DOB: [birth year], 49 y.o.   MRN: [id number]  HPI  49 year old female with panic disorder, agoraphobia, fibromyalgia  presents with continued pain in low back and left sided sciatica.   At last OV 04/2015 she was treated with  Heat and home PT, pt refused NSAIDs.   She reports  low back and leg pain improved some with stretching 50% better.. Still having  pain in left lateral leg. Waking her up at night.  PAin lying on left side.  No issue walking up stairs now.  No numbness, no tingling, no weakness. No fever. No swelling in leg.   Icy hot made it hurt more. Has not taken any OTC, since aleve made her feel more pain in beginning.  Review of Systems  Constitutional: Negative for fever and fatigue.  HENT: Negative for ear pain.   Eyes: Negative for pain.  Respiratory: Negative for chest tightness and shortness of breath.   Cardiovascular: Negative for chest pain, palpitations and leg swelling.  Gastrointestinal: Negative for abdominal pain.  Genitourinary: Negative for dysuria.       Objective:   Physical Exam  Constitutional: Vital signs are normal. She appears well-developed and well-nourished. She is cooperative.  Non-toxic appearance. She does not appear ill. No distress.  HENT:  Head: Normocephalic.  Right Ear: Hearing, tympanic membrane, external ear and ear canal normal. Tympanic membrane is not erythematous, not retracted and not bulging.  Left Ear: Hearing, tympanic membrane, external ear and ear canal normal. Tympanic membrane is not erythematous, not retracted and not bulging.  Nose: No mucosal edema or rhinorrhea. Right sinus exhibits no maxillary sinus tenderness and no frontal sinus tenderness. Left sinus exhibits no maxillary sinus tenderness and no frontal sinus tenderness.  Mouth/Throat: Uvula is midline, oropharynx is clear and moist and mucous membranes are normal.  Eyes: Conjunctivae, EOM and lids are normal.  Pupils are equal, round, and reactive to light. Lids are everted and swept, no foreign bodies found.  Neck: Trachea normal and normal range of motion. Neck supple. Carotid bruit is not present. No thyroid mass and no thyromegaly present.  Cardiovascular: Normal rate, regular rhythm, S1 normal, S2 normal, normal heart sounds, intact distal pulses and normal pulses.  Exam reveals no gallop and no friction rub.   No murmur heard. Pulmonary/Chest: Effort normal and breath sounds normal. No tachypnea. No respiratory distress. She has no decreased breath sounds. She has no wheezes. She has no rhonchi. She has no rales.  Abdominal: Soft. Normal appearance and bowel sounds are normal. There is no tenderness.  Musculoskeletal:       Left hip: She exhibits tenderness. She exhibits normal range of motion, normal strength and no bony tenderness.  ttp over left trochanteric bursa  Neurological: She is alert.  Skin: Skin is warm, dry and intact. No rash noted.  Psychiatric: Her speech is normal and behavior is normal. Judgment and thought content normal. Her mood appears not anxious. Cognition and memory are normal. She does not exhibit a depressed mood.          Assessment & Plan:

## 2015-09-28 NOTE — Progress Notes (Signed)
Pre visit review using our clinic review tool, if applicable. No additional management support is needed unless otherwise documented below in the visit note. 

## 2015-09-28 NOTE — Patient Instructions (Signed)
Start home exercises. Ice on left lateral  Hip.  Start diclofenac twice daily  For pain and inflammation.

## 2015-12-05 ENCOUNTER — Other Ambulatory Visit: Payer: Self-pay | Admitting: Family Medicine

## 2015-12-25 ENCOUNTER — Telehealth: Payer: Self-pay | Admitting: Family Medicine

## 2015-12-25 DIAGNOSIS — D696 Thrombocytopenia, unspecified: Secondary | ICD-10-CM

## 2015-12-25 DIAGNOSIS — Z13220 Encounter for screening for lipoid disorders: Secondary | ICD-10-CM

## 2015-12-25 DIAGNOSIS — Z1159 Encounter for screening for other viral diseases: Secondary | ICD-10-CM

## 2015-12-25 NOTE — Telephone Encounter (Signed)
-----   Message from Ellamae Sia sent at 12/13/2015  3:18 PM EST ----- Regarding: Lab orders for Friday, 12.30.16 Patient is scheduled for CPX labs, please order future labs, Thanks , Karna Christmas

## 2015-12-28 ENCOUNTER — Other Ambulatory Visit: Payer: 59

## 2016-01-04 ENCOUNTER — Encounter: Payer: 59 | Admitting: Family Medicine

## 2016-03-08 ENCOUNTER — Telehealth: Payer: Self-pay | Admitting: Family Medicine

## 2016-03-09 NOTE — Telephone Encounter (Signed)
Please call and schedule CPE with fasting labs prior for Dr. Bedsole.  

## 2016-03-13 NOTE — Telephone Encounter (Signed)
Left message asking pt to call office Dr Diona Browner next cpx 7/7

## 2016-03-18 NOTE — Telephone Encounter (Signed)
Tried calling pt no answer

## 2016-03-19 NOTE — Telephone Encounter (Signed)
Left message asking pt to call office  °

## 2016-03-21 ENCOUNTER — Encounter: Payer: Self-pay | Admitting: Family Medicine

## 2016-03-21 NOTE — Telephone Encounter (Signed)
Mailed letter °Please close °

## 2016-04-03 ENCOUNTER — Ambulatory Visit (INDEPENDENT_AMBULATORY_CARE_PROVIDER_SITE_OTHER): Payer: 59 | Admitting: Family Medicine

## 2016-04-03 ENCOUNTER — Other Ambulatory Visit: Payer: Self-pay | Admitting: Family Medicine

## 2016-04-03 ENCOUNTER — Encounter: Payer: Self-pay | Admitting: Family Medicine

## 2016-04-03 VITALS — BP 128/80 | HR 54 | Temp 98.4°F | Ht 64.5 in | Wt 109.2 lb

## 2016-04-03 DIAGNOSIS — Z1159 Encounter for screening for other viral diseases: Secondary | ICD-10-CM

## 2016-04-03 DIAGNOSIS — M797 Fibromyalgia: Secondary | ICD-10-CM

## 2016-04-03 DIAGNOSIS — F331 Major depressive disorder, recurrent, moderate: Secondary | ICD-10-CM

## 2016-04-03 DIAGNOSIS — Z13220 Encounter for screening for lipoid disorders: Secondary | ICD-10-CM | POA: Diagnosis not present

## 2016-04-03 DIAGNOSIS — R251 Tremor, unspecified: Secondary | ICD-10-CM | POA: Insufficient documentation

## 2016-04-03 DIAGNOSIS — Z1211 Encounter for screening for malignant neoplasm of colon: Secondary | ICD-10-CM

## 2016-04-03 LAB — COMPREHENSIVE METABOLIC PANEL
ALBUMIN: 4.4 g/dL (ref 3.5–5.2)
ALK PHOS: 89 U/L (ref 39–117)
ALT: 14 U/L (ref 0–35)
AST: 23 U/L (ref 0–37)
BUN: 5 mg/dL — ABNORMAL LOW (ref 6–23)
CALCIUM: 10.1 mg/dL (ref 8.4–10.5)
CHLORIDE: 101 meq/L (ref 96–112)
CO2: 31 mEq/L (ref 19–32)
Creatinine, Ser: 0.65 mg/dL (ref 0.40–1.20)
GFR: 101.82 mL/min (ref >60.00)
Glucose, Bld: 87 mg/dL (ref 70–99)
POTASSIUM: 3.3 meq/L — AB (ref 3.5–5.1)
Sodium: 140 mEq/L (ref 135–145)
TOTAL PROTEIN: 7.3 g/dL (ref 6.0–8.3)
Total Bilirubin: 0.5 mg/dL (ref 0.2–1.2)

## 2016-04-03 LAB — LIPID PANEL
CHOLESTEROL: 184 mg/dL (ref 0–200)
HDL: 69.3 mg/dL (ref >39.00)
LDL Cholesterol: 98 mg/dL (ref 0–99)
NonHDL: 114.41
TRIGLYCERIDES: 82 mg/dL (ref 0.0–149.0)
Total CHOL/HDL Ratio: 3
VLDL: 16.4 mg/dL (ref 0.0–40.0)

## 2016-04-03 LAB — CBC WITH DIFFERENTIAL/PLATELET
BASOS PCT: 0.5 % (ref 0.0–3.0)
Basophils Absolute: 0 10*3/uL (ref 0.0–0.1)
EOS PCT: 1 % (ref 0.0–5.0)
Eosinophils Absolute: 0.1 10*3/uL (ref 0.0–0.7)
HCT: 42.3 % (ref 36.0–46.0)
Hemoglobin: 14.3 g/dL (ref 12.0–15.0)
LYMPHS ABS: 3.1 10*3/uL (ref 0.7–4.0)
Lymphocytes Relative: 45.8 % (ref 12.0–46.0)
MCHC: 33.7 g/dL (ref 30.0–36.0)
MCV: 85.7 fl (ref 78.0–100.0)
MONO ABS: 0.5 10*3/uL (ref 0.1–1.0)
MONOS PCT: 8 % (ref 3.0–12.0)
NEUTROS ABS: 3 10*3/uL (ref 1.4–7.7)
Neutrophils Relative %: 44.7 % (ref 43.0–77.0)
Platelets: 304 10*3/uL (ref 150.0–400.0)
RBC: 4.94 Mil/uL (ref 3.87–5.11)
RDW: 14 % (ref 11.5–15.5)
WBC: 6.8 10*3/uL (ref 4.0–10.5)

## 2016-04-03 LAB — TSH: TSH: 3.62 u[IU]/mL (ref 0.35–4.50)

## 2016-04-03 LAB — T4, FREE: Free T4: 0.87 ng/dL (ref 0.60–1.60)

## 2016-04-03 LAB — T3, FREE: T3, Free: 3.4 pg/mL (ref 2.3–4.2)

## 2016-04-03 LAB — HEPATITIS C ANTIBODY: HCV AB: NEGATIVE

## 2016-04-03 LAB — VITAMIN B12: Vitamin B-12: 453 pg/mL (ref 211–911)

## 2016-04-03 NOTE — Assessment & Plan Note (Signed)
Stable control on sertraline  

## 2016-04-03 NOTE — Progress Notes (Addendum)
The patient is here for annual wellness exam and preventative care.   Panic attacks , agoraphbia: well controlled on zoloft.  She has not had alprazolam. She does feel more nervous and shaky lately, rare panic attacks. Anxious still around people.  She has noted  tremor  For year, some worse lately. Occ spilling things.  She has many family members with tremor. 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  Fibromyalgia and right sided body pain: tolerable lately.  Due for lab re-eval.  Lab Results  Component Value Date   CHOL 166 05/01/2014   HDL 50.20 05/01/2014   LDLCALC 97 05/01/2014   TRIG 94.0 05/01/2014   CHOLHDL 3 05/01/2014    Wt Readings from Last 3 Encounters:  04/03/16 109 lb 4 oz (49.555 kg)  09/28/15 116 lb (52.617 kg)  05/25/15 120 lb 4 oz (54.545 kg)   BP Readings from Last 3 Encounters:  04/03/16 128/80  09/28/15 110/70  05/25/15 114/80   She has been exercising lately 50 min a day, healthy eating.  Walking some. Weight loss has been from increase activity.  She does feel bloated after eating. BMs regular except occ diarrhea.   Review of Systems  Constitutional: Negative for fever, fatigue and unexpected weight change.  HENT: Negative for ear pain, congestion, sore throat, sneezing, trouble swallowing and sinus pressure.  Eyes: Negative for pain and itching.  Respiratory: Negative for cough, shortness of breath and wheezing.  Cardiovascular: Negative for chest pain, palpitations and leg swelling.  Gastrointestinal: Negative for nausea, abdominal pain, occ diarrhea, constipation and blood in stool.  Genitourinary: Negative for dysuria, hematuria, vaginal discharge, difficulty urinating and menstrual problem.  Musculoskeletal:  Pain to palpation on right side of body.  Skin: Negative for rash.  Neurological: Negative for syncope, weakness, light-headedness, numbness and headaches.  Psychiatric/Behavioral: Negative for confusion and dysphoric mood. The  patient is not nervous/anxious.   She has been feeling shaky lately Objective:   Physical Exam  Constitutional: Vital signs are normal. She appears well-developed and well-nourished. She is cooperative. Non-toxic appearance. She does not appear ill. No distress.  HENT:  Head: Normocephalic.  Right Ear: Hearing, tympanic membrane, external ear and ear canal normal.  Left Ear: Hearing, tympanic membrane, external ear and ear canal normal.  Nose: Nose normal.  Eyes: Conjunctivae, EOM and lids are normal. Pupils are equal, round, and reactive to light. No foreign bodies found.  Neck: Trachea normal and normal range of motion. Neck supple. Carotid bruit is not present. No mass and no thyromegaly present.  Cardiovascular: Normal rate, regular rhythm, S1 normal, S2 normal, normal heart sounds and intact distal pulses. Exam reveals no gallop.  No murmur heard.  Pulmonary/Chest: Effort normal and breath sounds normal. No respiratory distress. She has no wheezes. She has no rhonchi. She has no rales.  Abdominal: Soft. Normal appearance and bowel sounds are normal. She exhibits no distension, no fluid wave, no abdominal bruit and no mass. There is no hepatosplenomegaly. There is no tenderness. There is no rebound, no guarding and no CVA tenderness. No hernia.  Genitourinary: Vagina normal and uterus normal. No breast swelling, tenderness, discharge or bleeding. Pelvic exam was performed with patient supine. There is no rash, tenderness or lesion on the right labia. There is no rash, tenderness or lesion on the left labia. Uterus is not enlarged and not tender.No PAP TODAY. Right adnexum displays no mass, no tenderness and no fullness. Left adnexum displays no mass, no tenderness and no fullness.  Lymphadenopathy:  She  has no cervical adenopathy.  She has no axillary adenopathy.  Neurological: She is alert. She has normal strength. No cranial nerve deficit or sensory deficit.  Skin: Skin is  warm, dry and intact. No rash noted.  Psychiatric: Her speech is normal and behavior is normal. Judgment normal. Her mood appears not anxious. Cognition and memory are normal. She does not exhibit a depressed mood.  Assessment & Plan:   Complete Physical Exam: The patient's preventative maintenance and recommended screening tests for an annual wellness exam were reviewed in full today.  Brought up to date unless services declined.  Counselled on the importance of diet, exercise, and its role in overall health and mortality.  The patient's FH and SH was reviewed, including their home life, tobacco status, and drug and alcohol status.   Vaccines: Uptodate with Tdap Mammo: nml 2015 DVE/PAP: nml Pap 2015, plan repeat q 3 years. Colon: father dx age 9 with colon cancer. Due now for screening.      Hep C: due  HIV: refused  Body mass index is 18.47 kg/(m^2).

## 2016-04-03 NOTE — Progress Notes (Signed)
Pre visit review using our clinic review tool, if applicable. No additional management support is needed unless otherwise documented below in the visit note. 

## 2016-04-03 NOTE — Assessment & Plan Note (Signed)
Stable control. 

## 2016-04-03 NOTE — Addendum Note (Signed)
Addended byEliezer Lofts E on: 04/03/2016 10:09 AM   Modules accepted: SmartSet

## 2016-04-03 NOTE — Patient Instructions (Addendum)
Schedule mammogram on your own.  We will call with lab results.  Pick up stool cards at the lab.

## 2016-04-03 NOTE — Assessment & Plan Note (Signed)
Look into with  labs. Likely benign essential tremor, given family history.

## 2016-05-09 ENCOUNTER — Other Ambulatory Visit (INDEPENDENT_AMBULATORY_CARE_PROVIDER_SITE_OTHER): Payer: 59

## 2016-05-09 DIAGNOSIS — Z1211 Encounter for screening for malignant neoplasm of colon: Secondary | ICD-10-CM | POA: Diagnosis not present

## 2016-05-09 LAB — FECAL OCCULT BLOOD, IMMUNOCHEMICAL: Fecal Occult Bld: NEGATIVE

## 2016-06-10 ENCOUNTER — Other Ambulatory Visit: Payer: Self-pay | Admitting: Family Medicine

## 2016-06-11 ENCOUNTER — Telehealth: Payer: Self-pay

## 2016-06-11 NOTE — Telephone Encounter (Signed)
[facility] left v/m; name brand Zoloft is on manufacturer back order and no [facility] or can get [facility]; can generic zoloft be substituted?Please advise.

## 2016-06-12 NOTE — Telephone Encounter (Signed)
Jackie Ford (DPR signed) left v/m; generic zoloft does not work for pt and Jackie Ford wants to know if can get from different pharmacy and get name brand zoloft. Jackie Ford request cb. I tried [facility] and they cannot get Zoloft 25 mg name brand.

## 2016-06-12 NOTE — Telephone Encounter (Signed)
[facility] notified okay to use generic Zoloft per Dr. Diona Browner.  I also advised to make Mrs. Loden aware because the Brand Name is her preference.

## 2016-06-12 NOTE — Telephone Encounter (Signed)
yes

## 2016-06-12 NOTE — Telephone Encounter (Signed)
Left message for Jackie Ford that if they can find a pharmacy that has the Brand Name Zoloft 25 mg in stock, we will be happy to send in a prescription to that pharmacy.  Advised no [facility] has it in stock.  Advised to call us with pharmacy if they can find one that has this medication available.

## 2016-06-16 ENCOUNTER — Other Ambulatory Visit: Payer: Self-pay | Admitting: Family Medicine

## 2016-07-09 ENCOUNTER — Other Ambulatory Visit: Payer: Self-pay | Admitting: Family Medicine

## 2017-01-09 ENCOUNTER — Telehealth: Payer: Self-pay | Admitting: *Deleted

## 2017-01-09 NOTE — Telephone Encounter (Signed)
[facility] from [facility] stating Brand Name Zoloft is no longer covered on her insurance card.  Requested an alternative or a prior authorization.  Patient is the one who request brand name so I advised [facility] if patient wants to continue getting Brand Name Zoloft, she will need to pay for out of pocket.

## 2017-01-16 ENCOUNTER — Telehealth: Payer: Self-pay | Admitting: *Deleted

## 2017-01-16 NOTE — Telephone Encounter (Signed)
Received [facility] from [facility] requesting PA for Brand Name Only Zoloft 25 mg.  PA completed over the telephone.  Sent for pharmacy review.  Ref # [id number].

## 2017-01-16 NOTE — Telephone Encounter (Signed)
PA for Brand Name Only Zoloft was denied. The requested medication is not covered by the plan. Patient can contact member services for further assistance.  [facility] notified via fax.

## 2017-01-20 ENCOUNTER — Other Ambulatory Visit: Payer: Self-pay | Admitting: *Deleted

## 2017-01-20 MED ORDER — ZOLOFT 25 MG PO TABS: ORAL_TABLET | ORAL | 0 refills | Status: DC

## 2017-04-22 ENCOUNTER — Other Ambulatory Visit: Payer: Self-pay | Admitting: Family Medicine

## 2017-05-17 ENCOUNTER — Other Ambulatory Visit: Payer: Self-pay | Admitting: Family Medicine

## 2017-05-20 ENCOUNTER — Other Ambulatory Visit: Payer: Self-pay | Admitting: Family Medicine

## 2017-05-22 ENCOUNTER — Other Ambulatory Visit: Payer: Self-pay | Admitting: Family Medicine

## 2017-05-29 ENCOUNTER — Encounter: Payer: Self-pay | Admitting: Family Medicine

## 2017-05-29 ENCOUNTER — Ambulatory Visit (INDEPENDENT_AMBULATORY_CARE_PROVIDER_SITE_OTHER): Payer: 59 | Admitting: Family Medicine

## 2017-05-29 VITALS — BP 132/70 | HR 57 | Ht 64.5 in | Wt 106.8 lb

## 2017-05-29 DIAGNOSIS — L989 Disorder of the skin and subcutaneous tissue, unspecified: Secondary | ICD-10-CM | POA: Insufficient documentation

## 2017-05-29 DIAGNOSIS — F4000 Agoraphobia, unspecified: Secondary | ICD-10-CM | POA: Diagnosis not present

## 2017-05-29 DIAGNOSIS — F410 Panic disorder [episodic paroxysmal anxiety] without agoraphobia: Secondary | ICD-10-CM

## 2017-05-29 DIAGNOSIS — F331 Major depressive disorder, recurrent, moderate: Secondary | ICD-10-CM

## 2017-05-29 MED ORDER — ZOLOFT 25 MG PO TABS: ORAL_TABLET | ORAL | 1 refills | Status: DC

## 2017-05-29 NOTE — Patient Instructions (Addendum)
Continue zoloft 25 mg at bedtime.  Call if interested in trying the 50 mg tablet of generic zoloft instead for affordability.

## 2017-05-29 NOTE — Progress Notes (Signed)
   Subjective:    Patient ID: Jackie Ford, female    DOB: 1966/05/05, 51 y.o.   MRN: [id number]  HPI    51 year old female presents for follow up moderate major recurrent depression, panic attacks, agoraphobia and generalized anxiety. She is here with her husband who helps redirect the patient to the appropriate conversation.   She reports her mood is currently well controlled on  zoloft 25 mg daily.  they are interested in retrying generic zoloft.. Given 25 mg  Generic was not effective at lower dose... She may want to try generic again... Maybe not now.   Her brother passed away in 15-Apr-2017 of antiphospholipid syndrome. She is dealing with grief well.  PHQ9: 3/27 Bipolar questionnaire: negative GAD7: 10   Review of Systems  Constitutional: Negative for fatigue and fever.  HENT: Negative for ear pain.   Eyes: Negative for pain.  Respiratory: Negative for chest tightness and shortness of breath.   Cardiovascular: Negative for chest pain, palpitations and leg swelling.  Gastrointestinal: Negative for abdominal pain.  Genitourinary: Negative for dysuria.       Objective:   Physical Exam  Constitutional: Vital signs are normal. She appears well-developed and well-nourished. She is cooperative.  Non-toxic appearance. She does not appear ill. No distress.  HENT:  Head: Normocephalic.  Right Ear: Hearing, tympanic membrane, external ear and ear canal normal. Tympanic membrane is not erythematous, not retracted and not bulging.  Left Ear: Hearing, tympanic membrane, external ear and ear canal normal. Tympanic membrane is not erythematous, not retracted and not bulging.  Nose: No mucosal edema or rhinorrhea. Right sinus exhibits no maxillary sinus tenderness and no frontal sinus tenderness. Left sinus exhibits no maxillary sinus tenderness and no frontal sinus tenderness.  Mouth/Throat: Uvula is midline, oropharynx is clear and moist and mucous membranes are normal.  Eyes: Conjunctivae,  EOM and lids are normal. Pupils are equal, round, and reactive to light. Lids are everted and swept, no foreign bodies found.  Neck: Trachea normal and normal range of motion. Neck supple. Carotid bruit is not present. No thyroid mass and no thyromegaly present.  Cardiovascular: Normal rate, regular rhythm, S1 normal, S2 normal, normal heart sounds, intact distal pulses and normal pulses.  Exam reveals no gallop and no friction rub.   No murmur heard. Pulmonary/Chest: Effort normal and breath sounds normal. No tachypnea. No respiratory distress. She has no decreased breath sounds. She has no wheezes. She has no rhonchi. She has no rales.  Abdominal: Soft. Normal appearance and bowel sounds are normal. There is no tenderness.  Neurological: She is alert.  Skin: Skin is warm, dry and intact. No rash noted.  0.5 cm lesion/ulcer/ area where scab scratched off on right abdomen, minimal surrounding erythema  Psychiatric: Her speech is normal and behavior is normal. Judgment and thought content normal. Her mood appears not anxious. Cognition and memory are normal. She does not exhibit a depressed mood.          Assessment & Plan:

## 2017-05-29 NOTE — Assessment & Plan Note (Signed)
Area where scabbed scratched off.. Use topical antibiotics ointment and wash with warm soapy water.

## 2017-05-29 NOTE — Addendum Note (Signed)
Addended by: Eliezer Lofts E on: 05/29/2017 03:46 PM   Modules accepted: Orders

## 2017-05-29 NOTE — Assessment & Plan Note (Signed)
Excellent control on zoloft  25 mg daily.  Given cost she may want to try generic 50 mg sertraline ( lower dose not effective in generic form)

## 2018-01-08 ENCOUNTER — Other Ambulatory Visit: Payer: Self-pay | Admitting: Family Medicine

## 2018-01-10 NOTE — Telephone Encounter (Signed)
Last office visit and refill 6/1//2018. [facility] states to return in 11/2017 for CPE.  No future appointments.  Refill?

## 2018-01-11 NOTE — Telephone Encounter (Signed)
Mr. Zaun notified that Marylu needs to schedule a CPE with Dr. Diona Browner prior to next refill.  Dr. Diona Browner refilled her Zoloft for one month.  Mr. Tech states he will let his wife know.

## 2018-01-11 NOTE — Telephone Encounter (Signed)
Have pt make an appt. I will refill once.

## 2018-01-22 ENCOUNTER — Encounter: Payer: Self-pay | Admitting: Family Medicine

## 2018-01-22 ENCOUNTER — Other Ambulatory Visit (HOSPITAL_COMMUNITY)
Admission: RE | Admit: 2018-01-22 | Discharge: 2018-01-22 | Disposition: A | Discharge status: Home / Self Care | Payer: 59 | Source: Ambulatory Visit | Attending: Family Medicine | Admitting: Family Medicine

## 2018-01-22 ENCOUNTER — Other Ambulatory Visit: Payer: Self-pay

## 2018-01-22 ENCOUNTER — Ambulatory Visit (INDEPENDENT_AMBULATORY_CARE_PROVIDER_SITE_OTHER): Payer: 59 | Admitting: Family Medicine

## 2018-01-22 VITALS — BP 130/74 | HR 51 | Temp 98.3°F | Ht 64.5 in | Wt 108.8 lb

## 2018-01-22 DIAGNOSIS — Z23 Encounter for immunization: Secondary | ICD-10-CM | POA: Diagnosis not present

## 2018-01-22 DIAGNOSIS — F331 Major depressive disorder, recurrent, moderate: Secondary | ICD-10-CM | POA: Diagnosis not present

## 2018-01-22 DIAGNOSIS — D696 Thrombocytopenia, unspecified: Secondary | ICD-10-CM | POA: Diagnosis not present

## 2018-01-22 DIAGNOSIS — Z13220 Encounter for screening for lipoid disorders: Secondary | ICD-10-CM

## 2018-01-22 DIAGNOSIS — Z124 Encounter for screening for malignant neoplasm of cervix: Secondary | ICD-10-CM

## 2018-01-22 DIAGNOSIS — D6861 Antiphospholipid syndrome: Secondary | ICD-10-CM

## 2018-01-22 DIAGNOSIS — Z1151 Encounter for screening for human papillomavirus (HPV): Secondary | ICD-10-CM | POA: Insufficient documentation

## 2018-01-22 DIAGNOSIS — Z0000 Encounter for general adult medical examination without abnormal findings: Secondary | ICD-10-CM

## 2018-01-22 DIAGNOSIS — Z1211 Encounter for screening for malignant neoplasm of colon: Secondary | ICD-10-CM

## 2018-01-22 LAB — CBC WITH DIFFERENTIAL/PLATELET
BASOS ABS: 0 10*3/uL (ref 0.0–0.1)
Basophils Relative: 0.8 % (ref 0.0–3.0)
EOS ABS: 0.1 10*3/uL (ref 0.0–0.7)
Eosinophils Relative: 1.1 % (ref 0.0–5.0)
HCT: 42 % (ref 36.0–46.0)
Hemoglobin: 14.2 g/dL (ref 12.0–15.0)
LYMPHS ABS: 3 10*3/uL (ref 0.7–4.0)
Lymphocytes Relative: 51.1 % — ABNORMAL HIGH (ref 12.0–46.0)
MCHC: 33.9 g/dL (ref 30.0–36.0)
MCV: 84.7 fl (ref 78.0–100.0)
MONOS PCT: 6.9 % (ref 3.0–12.0)
Monocytes Absolute: 0.4 10*3/uL (ref 0.1–1.0)
NEUTROS PCT: 40.1 % — AB (ref 43.0–77.0)
Neutro Abs: 2.3 10*3/uL (ref 1.4–7.7)
PLATELETS: 255 10*3/uL (ref 150.0–400.0)
RBC: 4.96 Mil/uL (ref 3.87–5.11)
RDW: 13 % (ref 11.5–15.5)
WBC: 5.8 10*3/uL (ref 4.0–10.5)

## 2018-01-22 LAB — COMPREHENSIVE METABOLIC PANEL
ALK PHOS: 95 U/L (ref 39–117)
ALT: 12 U/L (ref 0–35)
AST: 19 U/L (ref 0–37)
Albumin: 4.2 g/dL (ref 3.5–5.2)
BILIRUBIN TOTAL: 0.5 mg/dL (ref 0.2–1.2)
BUN: 8 mg/dL (ref 6–23)
CALCIUM: 9.3 mg/dL (ref 8.4–10.5)
CO2: 33 mEq/L — ABNORMAL HIGH (ref 19–32)
CREATININE: 0.61 mg/dL (ref 0.40–1.20)
Chloride: 100 mEq/L (ref 96–112)
GFR: 108.8 mL/min (ref >60.00)
Glucose, Bld: 96 mg/dL (ref 70–99)
Potassium: 3.9 mEq/L (ref 3.5–5.1)
Sodium: 140 mEq/L (ref 135–145)
TOTAL PROTEIN: 7 g/dL (ref 6.0–8.3)

## 2018-01-22 LAB — LIPID PANEL
Cholesterol: 174 mg/dL (ref 0–200)
HDL: 50.3 mg/dL (ref >39.00)
LDL Cholesterol: 101 mg/dL — ABNORMAL HIGH (ref 0–99)
NonHDL: 123.76
TRIGLYCERIDES: 112 mg/dL (ref 0.0–149.0)
Total CHOL/HDL Ratio: 3
VLDL: 22.4 mg/dL (ref 0.0–40.0)

## 2018-01-22 MED ORDER — ZOLOFT 25 MG PO TABS: ORAL_TABLET | ORAL | 3 refills | Status: DC

## 2018-01-22 NOTE — Progress Notes (Signed)
Subjective:    Patient ID: Jackie Ford, female    DOB: Apr 02, 1966, 52 y.o.   MRN: [id number]  HPI  The patient is here for annual wellness exam and preventative care.     Panic attacks , agoraphbia: well controlled on zoloft low dose.   PHQ9: 5.. Given increased stress with mother health issues. Mother with dementia.  Not interested in counseling.  Due for fasting labs Diet:  healthy Exercise: none  Blood pressure 130/74, pulse (!) 51, temperature 98.3 F (36.8 C), temperature source Oral, height 5' 4.5" (1.638 m), weight 108 lb 12 oz (49.3 kg), last menstrual period 05/11/2015.  Review of Systems  Constitutional: Negative for fatigue and fever.  HENT: Negative for congestion.   Eyes: Negative for pain.  Respiratory: Negative for cough and shortness of breath.   Cardiovascular: Negative for chest pain, palpitations and leg swelling.  Gastrointestinal: Negative for abdominal pain.  Genitourinary: Negative for dysuria and vaginal bleeding.  Musculoskeletal: Negative for back pain.  Neurological: Negative for syncope, light-headedness and headaches.  Psychiatric/Behavioral: Negative for dysphoric mood.       Objective:   Physical Exam  Constitutional: Vital signs are normal. She appears well-developed and well-nourished. She is cooperative.  Non-toxic appearance. She does not appear ill. No distress.  HENT:  Head: Normocephalic.  Right Ear: Hearing, tympanic membrane, external ear and ear canal normal.  Left Ear: Hearing, tympanic membrane, external ear and ear canal normal.  Nose: Nose normal.  Eyes: Conjunctivae, EOM and lids are normal. Pupils are equal, round, and reactive to light. Lids are everted and swept, no foreign bodies found.  Neck: Trachea normal and normal range of motion. Neck supple. Carotid bruit is not present. No thyroid mass and no thyromegaly present.  Cardiovascular: Normal rate, regular rhythm, S1 normal, S2 normal, normal heart sounds and intact  distal pulses. Exam reveals no gallop.  No murmur heard. Pulmonary/Chest: Effort normal and breath sounds normal. No respiratory distress. She has no wheezes. She has no rhonchi. She has no rales.  Abdominal: Soft. Normal appearance and bowel sounds are normal. She exhibits no distension, no fluid wave, no abdominal bruit and no mass. There is no hepatosplenomegaly. There is no tenderness. There is no rebound, no guarding and no CVA tenderness. No hernia.  Genitourinary: Vagina normal and uterus normal. No breast swelling, tenderness, discharge or bleeding. Pelvic exam was performed with patient supine. There is no rash, tenderness or lesion on the right labia. There is no rash, tenderness or lesion on the left labia. Uterus is not enlarged and not tender. Cervix exhibits no motion tenderness, no discharge and no friability. Right adnexum displays no mass, no tenderness and no fullness. Left adnexum displays no mass, no tenderness and no fullness.  Lymphadenopathy:    She has no cervical adenopathy.    She has no axillary adenopathy.  Neurological: She is alert. She has normal strength. No cranial nerve deficit or sensory deficit.  Skin: Skin is warm, dry and intact. No rash noted.  Psychiatric: Her speech is normal and behavior is normal. Judgment normal. Her mood appears not anxious. Cognition and memory are normal. She does not exhibit a depressed mood.          Assessment & Plan:  The patient's preventative maintenance and recommended screening tests for an annual wellness exam were reviewed in full today. Brought up to date unless services declined.  Counselled on the importance of diet, exercise, and its role in overall health and mortality.  The patient's FH and SH was reviewed, including their home life, tobacco status, and drug and alcohol status.    Vaccines: Uptodate with Tdap, refused flu Mammo: nml 2015, due DVE/PAP: nml Pap 2015, plan repeat q 3 years given no  c-otesting. Colon: father dx age 66 with colon cancer. Overdue now for screening.      Hep C: due  HIV: refused

## 2018-01-22 NOTE — Assessment & Plan Note (Signed)
Moderate control with family stress. Continue zoloft refused med change and  Counselor. Will try melatonin for sleep.

## 2018-01-22 NOTE — Assessment & Plan Note (Signed)
Brother passed away last year with this.

## 2018-01-22 NOTE — Patient Instructions (Addendum)
Can try melatonin 3 mg for sleep.  Get back to regualr exercsie as able.   Please stop at the lab to have labs drawn. Call to set up mammogram on your own.  Please stop at the front desk to set up referral.

## 2018-01-26 ENCOUNTER — Telehealth: Payer: Self-pay | Admitting: Family Medicine

## 2018-01-26 LAB — CYTOLOGY - PAP
Diagnosis: NEGATIVE
HPV (WINDOPATH): NOT DETECTED

## 2018-01-26 NOTE — Telephone Encounter (Signed)
error 

## 2018-02-22 ENCOUNTER — Encounter: Payer: Self-pay | Admitting: Family Medicine

## 2018-03-19 ENCOUNTER — Encounter: Payer: Self-pay | Admitting: Family Medicine

## 2018-03-19 ENCOUNTER — Ambulatory Visit: Payer: 59 | Admitting: Family Medicine

## 2018-03-19 VITALS — BP 116/64 | HR 62 | Temp 98.4°F | Ht 64.5 in | Wt 112.5 lb

## 2018-03-19 DIAGNOSIS — F422 Mixed obsessional thoughts and acts: Secondary | ICD-10-CM

## 2018-03-19 DIAGNOSIS — F331 Major depressive disorder, recurrent, moderate: Secondary | ICD-10-CM

## 2018-03-19 DIAGNOSIS — F4000 Agoraphobia, unspecified: Secondary | ICD-10-CM | POA: Diagnosis not present

## 2018-03-19 MED ORDER — ZOLOFT 50 MG PO TABS: 50.0000 mg | ORAL_TABLET | Freq: Every day | ORAL | 3 refills | Status: DC

## 2018-03-19 MED ORDER — ALPRAZOLAM 0.25 MG PO TABS: 0.2500 mg | ORAL_TABLET | Freq: Every day | ORAL | 0 refills | Status: DC | PRN

## 2018-03-19 NOTE — Patient Instructions (Signed)
Increase sertraline to  50 mg daily at bedtime.  Can use alprazolam as needed until  sertraline helping.

## 2018-03-19 NOTE — Progress Notes (Signed)
   Subjective:    Patient ID: Jackie Ford, female    DOB: [birth year], 52 y.o.   MRN: [id number]  HPI     52 year old female with obsessive compulsive disorder, depression moderate recurrent, agoraphobia... Now with situational anxiety due to the death of a loved one ( mother).  She is currently on zoloft 25 mg daily   She reports her mother passed away 03-24-2018.  Also family unrest.. Fighting with sister etc.  She has been feeling more anxious.  Trouble falling asleep.. Thinking Mom. Early morning waking.   Occ thoughts of suicide.. Last night. She is really mad at her Mom.  She is grieving but sad spells are getting worse.  Review of Systems  Constitutional: Negative for fatigue and fever.  HENT: Negative for ear pain.   Eyes: Negative for pain.  Respiratory: Negative for chest tightness and shortness of breath.   Cardiovascular: Negative for chest pain, palpitations and leg swelling.  Gastrointestinal: Negative for abdominal pain.  Genitourinary: Negative for dysuria.       Objective:   Physical Exam  Constitutional: Vital signs are normal. She appears well-developed and well-nourished. She is cooperative.  Non-toxic appearance. She does not appear ill. No distress.  HENT:  Head: Normocephalic.  Right Ear: Hearing, tympanic membrane, external ear and ear canal normal. Tympanic membrane is not erythematous, not retracted and not bulging.  Left Ear: Hearing, tympanic membrane, external ear and ear canal normal. Tympanic membrane is not erythematous, not retracted and not bulging.  Nose: No mucosal edema or rhinorrhea. Right sinus exhibits no maxillary sinus tenderness and no frontal sinus tenderness. Left sinus exhibits no maxillary sinus tenderness and no frontal sinus tenderness.  Mouth/Throat: Uvula is midline, oropharynx is clear and moist and mucous membranes are normal.  Eyes: Pupils are equal, round, and reactive to light. Conjunctivae, EOM and lids are normal. Lids are  everted and swept, no foreign bodies found.  Neck: Trachea normal and normal range of motion. Neck supple. Carotid bruit is not present. No thyroid mass and no thyromegaly present.  Cardiovascular: Normal rate, regular rhythm, S1 normal, S2 normal, normal heart sounds, intact distal pulses and normal pulses. Exam reveals no gallop and no friction rub.  No murmur heard. Pulmonary/Chest: Effort normal and breath sounds normal. No tachypnea. No respiratory distress. She has no decreased breath sounds. She has no wheezes. She has no rhonchi. She has no rales.  Abdominal: Soft. Normal appearance and bowel sounds are normal. There is no tenderness.  Neurological: She is alert.  Skin: Skin is warm, dry and intact. No rash noted.  Psychiatric: Her speech is normal. Judgment and thought content normal. Her mood appears not anxious. She is withdrawn. Cognition and memory are normal. She exhibits a depressed mood. She expresses no homicidal and no suicidal ideation. She expresses no suicidal plans and no homicidal plans.   Tearful   no current suicidal thoughts.          Assessment & Plan:

## 2018-03-19 NOTE — Assessment & Plan Note (Signed)
Worsening control  With loss of mother.  Increase sertraline to 50 mg daily, offered counseling referral ( refused). Has bereavement services available through [facility].   can sue alprazolam temporarily while awaiting for sertraline to start working. Discussed with p alprazolam is a  temporary medication.

## 2018-10-14 ENCOUNTER — Ambulatory Visit: Payer: 59 | Admitting: Family Medicine

## 2018-10-15 ENCOUNTER — Ambulatory Visit: Payer: 59 | Admitting: Family Medicine

## 2018-10-15 VITALS — BP 130/80 | HR 58 | Temp 97.8°F | Ht 64.5 in | Wt 112.2 lb

## 2018-10-15 DIAGNOSIS — R0789 Other chest pain: Secondary | ICD-10-CM

## 2018-10-15 DIAGNOSIS — N644 Mastodynia: Secondary | ICD-10-CM | POA: Diagnosis not present

## 2018-10-15 DIAGNOSIS — Z23 Encounter for immunization: Secondary | ICD-10-CM | POA: Diagnosis not present

## 2018-10-15 NOTE — Progress Notes (Signed)
Subjective:    Patient ID: Jackie Ford, female    DOB: [birth year], 52 y.o.   MRN: [id number]  HPI   52 year old female presents with new onset pain in right chest wall in  X 2 weeks. Over time  Right breast started to hurt., behind nipple through to back. No lumps in breast Improved in last 2 days. No longer having pain.   No fall, no injury know.  No skin changes, no rash... Skin was hypersensitive for a while.. No better now. Did have more pain with deep breaths or coughing.  No cough congestion, SOB.   Hx of right breast pain in past.. Resolved overtime.  Hx of breast cyst.   Has appt for mammogram 11/15. Screening mammogram.   She is more anxious lately with fathers new health issue. Bowel resection.  Social History /Family History/Past Medical History reviewed in detail and updated in EMR if needed. Blood pressure 130/80, pulse (!) 58, temperature 97.8 F (36.6 C), temperature source Oral, height 5' 4.5" (1.638 m), weight 112 lb 4 oz (50.9 kg), last menstrual period 05/11/2015.  Review of Systems  Constitutional: Negative for fatigue and fever.  HENT: Negative for congestion.   Eyes: Negative for pain.  Respiratory: Negative for cough and shortness of breath.   Cardiovascular: Positive for chest pain. Negative for palpitations and leg swelling.  Gastrointestinal: Negative for abdominal pain.  Genitourinary: Negative for dysuria and vaginal bleeding.  Musculoskeletal: Negative for back pain.  Neurological: Negative for syncope, light-headedness and headaches.  Psychiatric/Behavioral: Negative for dysphoric mood.       Objective:   Physical Exam  Constitutional: Vital signs are normal. She appears well-developed and well-nourished. She is cooperative.  Non-toxic appearance. She does not appear ill. No distress.  HENT:  Head: Normocephalic.  Right Ear: Hearing, tympanic membrane, external ear and ear canal normal. Tympanic membrane is not erythematous, not retracted and  not bulging.  Left Ear: Hearing, tympanic membrane, external ear and ear canal normal. Tympanic membrane is not erythematous, not retracted and not bulging.  Nose: No mucosal edema or rhinorrhea. Right sinus exhibits no maxillary sinus tenderness and no frontal sinus tenderness. Left sinus exhibits no maxillary sinus tenderness and no frontal sinus tenderness.  Mouth/Throat: Uvula is midline, oropharynx is clear and moist and mucous membranes are normal.  Eyes: Pupils are equal, round, and reactive to light. Conjunctivae, EOM and lids are normal. Lids are everted and swept, no foreign bodies found.  Neck: Trachea normal and normal range of motion. Neck supple. Carotid bruit is not present. No thyroid mass and no thyromegaly present.  Cardiovascular: Normal rate, regular rhythm, S1 normal, S2 normal, normal heart sounds, intact distal pulses and normal pulses. Exam reveals no gallop and no friction rub.  No murmur heard. Pulmonary/Chest: Effort normal and breath sounds normal. No tachypnea. No respiratory distress. She has no decreased breath sounds. She has no wheezes. She has no rhonchi. She has no rales. She exhibits bony tenderness. Right breast exhibits tenderness. Right breast exhibits no inverted nipple, no mass, no nipple discharge and no skin change. Left breast exhibits no inverted nipple, no mass, no nipple discharge, no skin change and no tenderness. No breast discharge. Breasts are symmetrical.  Abdominal: Soft. Normal appearance and bowel sounds are normal. There is no tenderness.  Neurological: She is alert.  Skin: Skin is warm, dry and intact. No rash noted.  Psychiatric: Her speech is normal and behavior is normal. Judgment and thought content normal. Her  mood appears not anxious. Cognition and memory are normal. She does not exhibit a depressed mood.          Assessment & Plan:

## 2018-10-15 NOTE — Patient Instructions (Addendum)
Please stop at the front desk to set up referral.  Chest wall stretches.

## 2018-10-22 ENCOUNTER — Ambulatory Visit
Admission: RE | Admit: 2018-10-22 | Discharge: 2018-10-22 | Disposition: A | Discharge status: Home / Self Care | Payer: 59 | Source: Ambulatory Visit | Attending: Family Medicine | Admitting: Family Medicine

## 2018-10-22 DIAGNOSIS — N644 Mastodynia: Secondary | ICD-10-CM

## 2018-10-22 DIAGNOSIS — R922 Inconclusive mammogram: Secondary | ICD-10-CM | POA: Diagnosis not present

## 2018-10-22 DIAGNOSIS — N6489 Other specified disorders of breast: Secondary | ICD-10-CM | POA: Diagnosis not present

## 2018-10-22 DIAGNOSIS — R0789 Other chest pain: Secondary | ICD-10-CM

## 2018-11-08 ENCOUNTER — Encounter: Payer: Self-pay | Admitting: Family Medicine

## 2018-11-08 DIAGNOSIS — R0789 Other chest pain: Secondary | ICD-10-CM | POA: Insufficient documentation

## 2018-11-08 NOTE — Assessment & Plan Note (Signed)
Refer for eval with mammogram.

## 2018-11-08 NOTE — Assessment & Plan Note (Signed)
Most liekly MSK in origin. HAs had similar in past. Resolved with time. If mammogram negative.recommended: Heat, stretching.

## 2019-02-16 ENCOUNTER — Other Ambulatory Visit: Payer: Self-pay | Admitting: Family Medicine

## 2019-03-23 ENCOUNTER — Other Ambulatory Visit: Payer: Self-pay | Admitting: Family Medicine

## 2019-03-23 NOTE — Telephone Encounter (Signed)
Last office visit 10/15/2018 for Breast pain.  Last appointment that discussed depression 03/19/2018.  Last refilled 03/19/2018 for #30 with 3 refills.   No future appointments.  Ok to refill?

## 2019-03-23 NOTE — Telephone Encounter (Signed)
Contact pt.. given mood not discussed in a year.. see if she is able and willing to do a webex visit. If so schedule and refill until then.  If not schedule mood follow up in 2 months and refill until then.

## 2019-03-24 NOTE — Telephone Encounter (Signed)
Shirlean Mylar,   Can you help me get Mrs. Zieger scheduled for a virtual visit phone or Web Ex with Dr. Diona Browner?

## 2019-03-24 NOTE — Telephone Encounter (Signed)
You can just schedule a phone visit for Friday if patient is agreeable.

## 2019-03-24 NOTE — Telephone Encounter (Signed)
Left message asking pt to call office  °

## 2019-03-24 NOTE — Telephone Encounter (Signed)
Spoke to lynn she stated they are having problems with dr Diona Browner web ex  They have ticket in for this do you just want me to schedule follow up appointment in 2 months

## 2019-03-28 NOTE — Telephone Encounter (Signed)
Left message asking pt to call office  °

## 2019-03-29 ENCOUNTER — Ambulatory Visit (INDEPENDENT_AMBULATORY_CARE_PROVIDER_SITE_OTHER): Payer: 59 | Admitting: Family Medicine

## 2019-03-29 ENCOUNTER — Other Ambulatory Visit: Payer: Self-pay

## 2019-03-29 ENCOUNTER — Encounter: Payer: Self-pay | Admitting: Family Medicine

## 2019-03-29 VITALS — BP 120/67 | HR 62 | Wt 112.0 lb

## 2019-03-29 DIAGNOSIS — F422 Mixed obsessional thoughts and acts: Secondary | ICD-10-CM

## 2019-03-29 DIAGNOSIS — F331 Major depressive disorder, recurrent, moderate: Secondary | ICD-10-CM | POA: Diagnosis not present

## 2019-03-29 MED ORDER — ZOLOFT 25 MG PO TABS: 25.0000 mg | ORAL_TABLET | Freq: Every day | ORAL | 1 refills | Status: DC

## 2019-03-29 NOTE — Patient Instructions (Signed)
Continue current dose of zoloft.  You refill has been sent in.

## 2019-03-29 NOTE — Assessment & Plan Note (Signed)
Excellent control onf mood on zoloft low dose. Refilled for 6 months.  She will reschedule CPX in 3 months.

## 2019-03-29 NOTE — Progress Notes (Signed)
VIRTUAL VISIT  I connected with@ on 03/29/19 at  2:00 PM EDT by [facility] and verified that I am speaking with the correct person using two identifiers.   I discussed the limitations, risks, security and privacy concerns of performing an evaluation and management service by [facility] and the availability of in person appointments. I also discussed with the patient that there may be a patient responsible charge related to this service. The patient expressed understanding and agreed to proceed.  Patient location: Home Provider Location: [facility] Participants: Eliezer Lofts and Davonna Belling   Chief Complaint  Patient presents with  . Follow-up    Mood/Med Refill    History of Present Illness: 53 year old female presents for follow up MDD and OCD.  Her husband is also presents to provide history.  She is taking zoloft 25 mg daily.  No SE to this medication.  She is sleeping well at night.  no SI, No HI   Office Visit from 03/29/2019 in [place] at [facility] Total Score  0      COVID 19 screen No recent travel or known exposure to COVID19 The patient denies respiratory symptoms of COVID 19 at this time.  The importance of social distancing was discussed today.   Review of Systems  Constitutional: Negative for fever.  HENT: Negative for ear discharge and ear pain.   Eyes: Negative for blurred vision.  Respiratory: Negative for shortness of breath.   Cardiovascular: Negative for chest pain.  Gastrointestinal: Negative for vomiting.  Genitourinary: Negative for dysuria.  Musculoskeletal: Negative for myalgias.      Past Medical History:  Diagnosis Date  . Bronchitis     reports that she has quit smoking. She has never used smokeless tobacco. She reports that she does not drink alcohol or use drugs.   Current Outpatient Medications:  Marland Kitchen  Glucosamine-Chondroitin-Vit C 2000-1200-60 MG/30ML LIQD, Take 10 mLs by mouth daily., Disp: , Rfl:  .  Hyaluronic  Acid-Vitamin C (HYALURONIC ACID PO), Take 1 capsule by mouth daily., Disp: , Rfl:  .  Multiple Vitamins-Minerals (HAIR/SKIN/NAILS) TABS, Take 1 tablet by mouth daily., Disp: , Rfl:  .  NON FORMULARY, Take 1 each by mouth daily. Beauty Bursts Gourmet Collagen Soft Chews, Disp: , Rfl:  .  Probiotic Product (PROBIOTIC DAILY PO), Take 1 capsule by mouth daily., Disp: , Rfl:  .  Saw Palmetto, Serenoa repens, 320 MG CAPS, Take 1 capsule by mouth daily., Disp: , Rfl:  .  ZOLOFT 25 MG tablet, Take 25 mg by mouth daily., Disp: , Rfl:    Observations/Objective: Blood pressure 120/67, pulse 62, weight 112 lb (50.8 kg), last menstrual period 05/11/2015.  Physical Exam Constitutional:      Appearance: She is normal weight.  Pulmonary:     Effort: Pulmonary effort is normal.  Neurological:     Mental Status: She is alert.  Psychiatric:        Mood and Affect: Mood normal.        Behavior: Behavior normal.        Thought Content: Thought content normal.        Judgment: Judgment normal.      Assessment and Plan See problem based charting   I discussed the assessment and treatment plan with the patient. The patient was provided an opportunity to ask questions and all were answered. The patient agreed with the plan and demonstrated an understanding of the instructions.   The patient was  advised to call back or seek an in-person evaluation if the symptoms worsen or if the condition fails to improve as anticipated.     Eliezer Lofts, MD

## 2019-03-30 NOTE — Telephone Encounter (Signed)
cpx 7/24 Labs 7/20 Pt aware

## 2019-07-14 ENCOUNTER — Telehealth: Payer: Self-pay

## 2019-07-14 NOTE — Telephone Encounter (Signed)
Left detailed VM w COVID screen and back door lab info   

## 2019-07-18 ENCOUNTER — Other Ambulatory Visit (INDEPENDENT_AMBULATORY_CARE_PROVIDER_SITE_OTHER): Payer: 59

## 2019-07-18 ENCOUNTER — Telehealth: Payer: Self-pay | Admitting: Family Medicine

## 2019-07-18 DIAGNOSIS — Z110 Encounter for screening for intestinal infectious diseases: Secondary | ICD-10-CM

## 2019-07-18 DIAGNOSIS — Z13220 Encounter for screening for lipoid disorders: Secondary | ICD-10-CM

## 2019-07-18 LAB — COMPREHENSIVE METABOLIC PANEL
ALT: 13 U/L (ref 0–35)
AST: 20 U/L (ref 0–37)
Albumin: 4.3 g/dL (ref 3.5–5.2)
Alkaline Phosphatase: 99 U/L (ref 39–117)
BUN: 8 mg/dL (ref 6–23)
CO2: 29 mEq/L (ref 19–32)
Calcium: 9.4 mg/dL (ref 8.4–10.5)
Chloride: 105 mEq/L (ref 96–112)
Creatinine, Ser: 0.62 mg/dL (ref 0.40–1.20)
GFR: 99.91 mL/min (ref >60.00)
Glucose, Bld: 93 mg/dL (ref 70–99)
Potassium: 3.9 mEq/L (ref 3.5–5.1)
Sodium: 142 mEq/L (ref 135–145)
Total Bilirubin: 0.6 mg/dL (ref 0.2–1.2)
Total Protein: 6.8 g/dL (ref 6.0–8.3)

## 2019-07-18 LAB — LIPID PANEL
Cholesterol: 183 mg/dL (ref 0–200)
HDL: 56.1 mg/dL (ref >39.00)
LDL Cholesterol: 109 mg/dL — ABNORMAL HIGH (ref 0–99)
NonHDL: 127.38
Total CHOL/HDL Ratio: 3
Triglycerides: 91 mg/dL (ref 0.0–149.0)
VLDL: 18.2 mg/dL (ref 0.0–40.0)

## 2019-07-18 NOTE — Telephone Encounter (Signed)
-----   Message from Ellamae Sia sent at 07/12/2019  2:22 PM EDT ----- Regarding: Lab orders for Monday, 7.20.20 Patient is scheduled for CPX labs, please order future labs, Thanks , Karna Christmas

## 2019-07-22 ENCOUNTER — Encounter: Payer: 59 | Admitting: Family Medicine

## 2019-08-23 ENCOUNTER — Telehealth: Payer: Self-pay

## 2019-08-23 NOTE — Telephone Encounter (Signed)
Noted. Agree with evaluation.  

## 2019-08-23 NOTE — Telephone Encounter (Signed)
Pts husband (DPR signed) said pt having tingling and numbness in forehead for 1 month; now pain 1" above eyebrows- not sure if sharp or dull pain but is continuous since 08/20/19. Also pts eyebrows stay up all the time like pt is startled or shocked. No dizziness,vision changes, weakness in arms or legs,CP or SOB.no memory loss or confusion. Pt has T 99.8 that fluctuates to normal for 2 wks; pt has watery diarrhea for 1 wk. Pt is also more irritable than usual. 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 Mr Aldama will take pt to [facility] ED for eval and imaging if needed.FYI to Dr Diona Browner.

## 2019-09-26 ENCOUNTER — Other Ambulatory Visit: Payer: Self-pay | Admitting: Family Medicine

## 2019-10-11 ENCOUNTER — Encounter: Payer: Self-pay | Admitting: Family Medicine

## 2019-10-11 ENCOUNTER — Other Ambulatory Visit: Payer: Self-pay

## 2019-10-11 ENCOUNTER — Ambulatory Visit (INDEPENDENT_AMBULATORY_CARE_PROVIDER_SITE_OTHER): Payer: 59 | Admitting: Family Medicine

## 2019-10-11 VITALS — BP 112/74 | HR 65 | Temp 98.5°F | Ht 64.5 in | Wt 115.8 lb

## 2019-10-11 DIAGNOSIS — F4000 Agoraphobia, unspecified: Secondary | ICD-10-CM

## 2019-10-11 DIAGNOSIS — F331 Major depressive disorder, recurrent, moderate: Secondary | ICD-10-CM

## 2019-10-11 DIAGNOSIS — Z1211 Encounter for screening for malignant neoplasm of colon: Secondary | ICD-10-CM | POA: Diagnosis not present

## 2019-10-11 DIAGNOSIS — Z0000 Encounter for general adult medical examination without abnormal findings: Secondary | ICD-10-CM

## 2019-10-11 DIAGNOSIS — Z23 Encounter for immunization: Secondary | ICD-10-CM

## 2019-10-11 DIAGNOSIS — R448 Other symptoms and signs involving general sensations and perceptions: Secondary | ICD-10-CM

## 2019-10-11 NOTE — Patient Instructions (Addendum)
Work on low cholesterol,   increase exercise as much as able. Set up mammogram on your own.  Stop at lab on way out for colon cancer screen.  Start flonase 2 sprays per nostril daily... call if pressure in face not improving as able.

## 2019-10-11 NOTE — Assessment & Plan Note (Signed)
UNclear cause  ? Holding tension in face... not relaxing forehead.  ? sinus pressure.. trial of flonase.

## 2019-10-11 NOTE — Progress Notes (Signed)
Chief Complaint  Patient presents with  . Annual Exam    History of Present Illness: HPI  The patient is here for annual wellness exam and preventative care.    She has noted Pins and needles or pressure over lower forehead and upper bridge of nose. Ongoing constantly for a few months. Not worsening or spreading, no rash. No fever.  Stopped toothpaste, mouthwash, npt wearing contact.  Has not tried anything to treat.  No vision change, no smell change. No sneeze, no itchy eyes.   She feels mask may make it worse.   Panic attacks , agoraphobia, MDD: well controlled on zoloft low dose.  No SE. Recent loss of cat... so in last week slight worsening.  PHQ9: 9.  Diet: worse lately  Exercise: doing leg lifts and sit ups.   COVID 19 screen No recent travel or known exposure to COVID19 The patient denies respiratory symptoms of COVID 19 at this time.  The importance of social distancing was discussed today.   Review of Systems  Constitutional: Negative for chills and fever.  HENT: Negative for congestion and ear pain.   Eyes: Negative for pain and redness.  Respiratory: Negative for cough and shortness of breath.   Cardiovascular: Negative for chest pain, palpitations and leg swelling.  Gastrointestinal: Negative for abdominal pain, blood in stool, constipation, diarrhea, nausea and vomiting.  Genitourinary: Negative for dysuria.  Musculoskeletal: Negative for falls and myalgias.  Skin: Negative for rash.  Neurological: Negative for dizziness.  Psychiatric/Behavioral: Negative for depression. The patient is not nervous/anxious.       Past Medical History:  Diagnosis Date  . Bronchitis     reports that she has quit smoking. She has never used smokeless tobacco. She reports that she does not drink alcohol or use drugs.   Current Outpatient Medications:  Marland Kitchen  Glucosamine-Chondroitin-Vit C 2000-1200-60 MG/30ML LIQD, Take 10 mLs by mouth daily., Disp: , Rfl:  .  Hyaluronic  Acid-Vitamin C (HYALURONIC ACID PO), Take 1 capsule by mouth daily., Disp: , Rfl:  .  Multiple Vitamins-Minerals (HAIR/SKIN/NAILS) TABS, Take 1 tablet by mouth daily., Disp: , Rfl:  .  NON FORMULARY, Take 1 each by mouth daily. Beauty Bursts Gourmet Collagen Soft Chews, Disp: , Rfl:  .  Probiotic Product (PROBIOTIC DAILY PO), Take 1 capsule by mouth daily., Disp: , Rfl:  .  Saw Palmetto, Serenoa repens, 320 MG CAPS, Take 1 capsule by mouth daily., Disp: , Rfl:  .  ZOLOFT 25 MG tablet, TAKE 1 TABLET BY MOUTH EVERY DAY, Disp: 30 tablet, Rfl: 0   Observations/Objective: Last menstrual period 05/11/2015.  Physical Exam Constitutional:      General: She is not in acute distress.    Appearance: Normal appearance. She is well-developed. She is not ill-appearing or toxic-appearing.  HENT:     Head: Normocephalic.     Right Ear: Hearing, tympanic membrane, ear canal and external ear normal.     Left Ear: Hearing, tympanic membrane, ear canal and external ear normal.     Nose: Nose normal.  Eyes:     General: Lids are normal. Lids are everted, no foreign bodies appreciated.     Conjunctiva/sclera: Conjunctivae normal.     Pupils: Pupils are equal, round, and reactive to light.  Neck:     Musculoskeletal: Normal range of motion and neck supple.     Thyroid: No thyroid mass or thyromegaly.     Vascular: No carotid bruit.     Trachea: Trachea normal.  Cardiovascular:     Rate and Rhythm: Normal rate and regular rhythm.     Heart sounds: Normal heart sounds, S1 normal and S2 normal. No murmur. No gallop.   Pulmonary:     Effort: Pulmonary effort is normal. No respiratory distress.     Breath sounds: Normal breath sounds. No wheezing, rhonchi or rales.  Abdominal:     General: Bowel sounds are normal. There is no distension or abdominal bruit.     Palpations: Abdomen is soft. There is no fluid wave or mass.     Tenderness: There is no abdominal tenderness. There is no guarding or rebound.      Hernia: No hernia is present.  Lymphadenopathy:     Cervical: No cervical adenopathy.  Skin:    General: Skin is warm and dry.     Findings: No rash.  Neurological:     Mental Status: She is alert.     Cranial Nerves: No cranial nerve deficit.     Sensory: No sensory deficit.  Psychiatric:        Mood and Affect: Mood is not anxious or depressed.        Speech: Speech normal.        Behavior: Behavior normal. Behavior is cooperative.        Judgment: Judgment normal.      Assessment and Plan   The patient's preventative maintenance and recommended screening tests for an annual wellness exam were reviewed in full today. Brought up to date unless services declined.  Counselled on the importance of diet, exercise, and its role in overall health and mortality. The patient's FH and SH was reviewed, including their home life, tobacco status, and drug and alcohol status.   Vaccines: Uptodate with Tdap, given flu Mammo: nml 2019 DVE/PAP: nml Pap 2019, plan repeat q 5 years. Colon: father dx age 57 with colon cancer. ifob negative 2017, due      Hep C: neg HIV: refused   Facial pressure UNclear cause  ? Holding tension in face... not relaxing forehead.  ? sinus pressure.. trial of flonase.  Depression, major, recurrent, moderate ([place]) Well controlled. Continue current medication.     Eliezer Lofts, MD

## 2019-10-11 NOTE — Assessment & Plan Note (Signed)
Well controlled. Continue current medication.  

## 2019-10-11 NOTE — Addendum Note (Signed)
Addended by: Carter Kitten on: 10/11/2019 02:52 PM   Modules accepted: Orders

## 2019-10-24 ENCOUNTER — Other Ambulatory Visit: Payer: Self-pay | Admitting: Family Medicine

## 2020-02-06 ENCOUNTER — Telehealth: Payer: Self-pay | Admitting: Family Medicine

## 2020-02-06 MED ORDER — ZOLOFT 25 MG PO TABS: 25.0000 mg | ORAL_TABLET | Freq: Every day | ORAL | 5 refills | Status: DC

## 2020-02-06 NOTE — Telephone Encounter (Signed)
Spouse came in stating pt needs rx for  zoloft [facility] He stated pharmacy will be contacting you for refill

## 2020-02-06 NOTE — Telephone Encounter (Signed)
Refills sent as requested

## 2020-07-13 ENCOUNTER — Ambulatory Visit: Payer: BC Managed Care – PPO | Admitting: Family Medicine

## 2020-10-08 IMAGING — US ULTRASOUND RIGHT BREAST LIMITED
1 series · 4 of 4 positions shown · non-contrast
Comparison: Previous exam(s).

CLINICAL DATA: 52-year-old female presenting for evaluation of
diffuse intermittent shooting pain throughout the right breast for 2
weeks. More recently she began have focal pain in the upper-outer
quadrant of the right breast.

EXAM:
DIGITAL DIAGNOSTIC BILATERAL MAMMOGRAM WITH CAD AND TOMO
ULTRASOUND RIGHT BREAST

[Series 1: ultrasound right breast limited · 0.05mm/px · 4 of 4 slices shown]
[im 1/4]
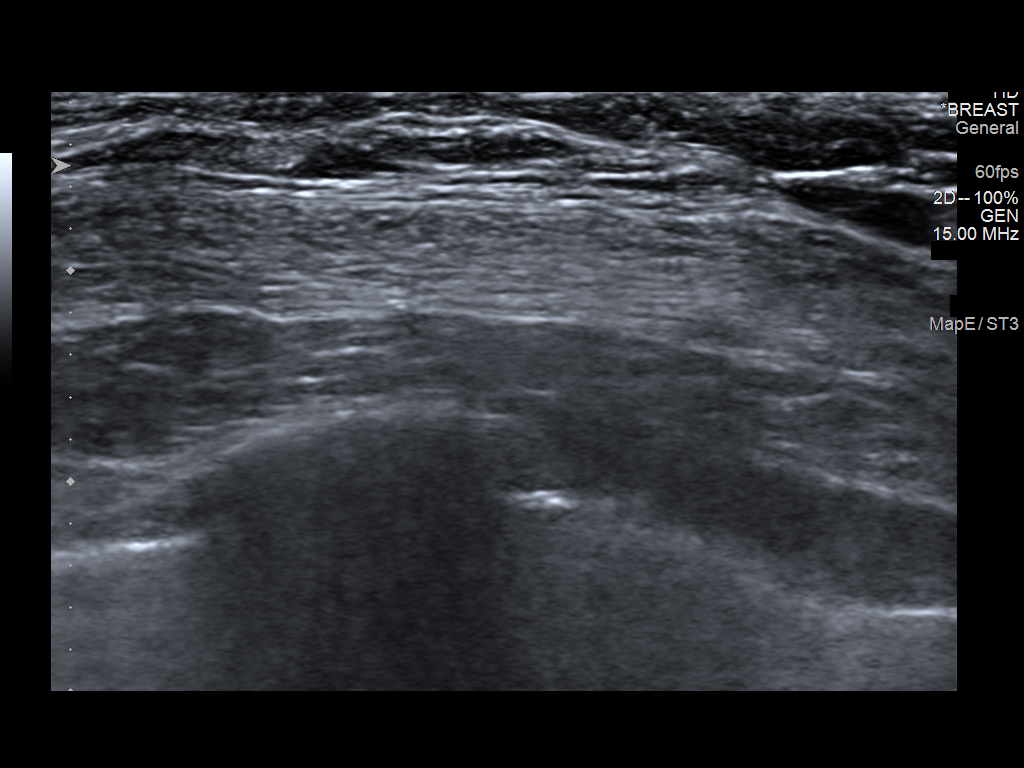
[im 2/4]
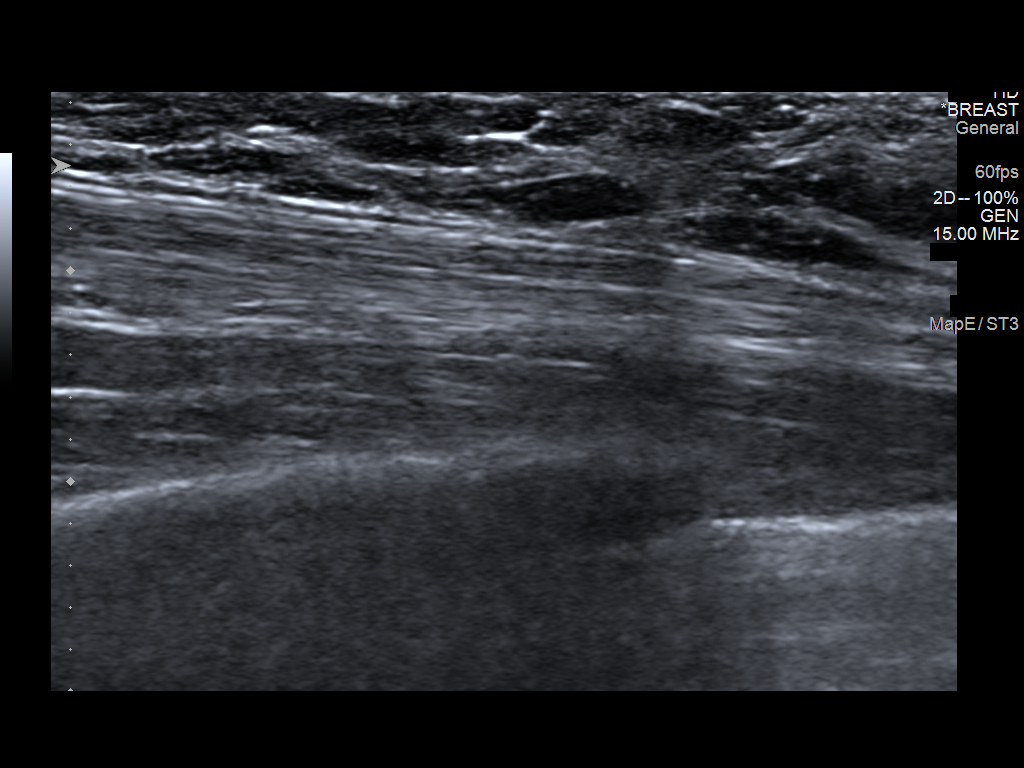
[im 3/4]
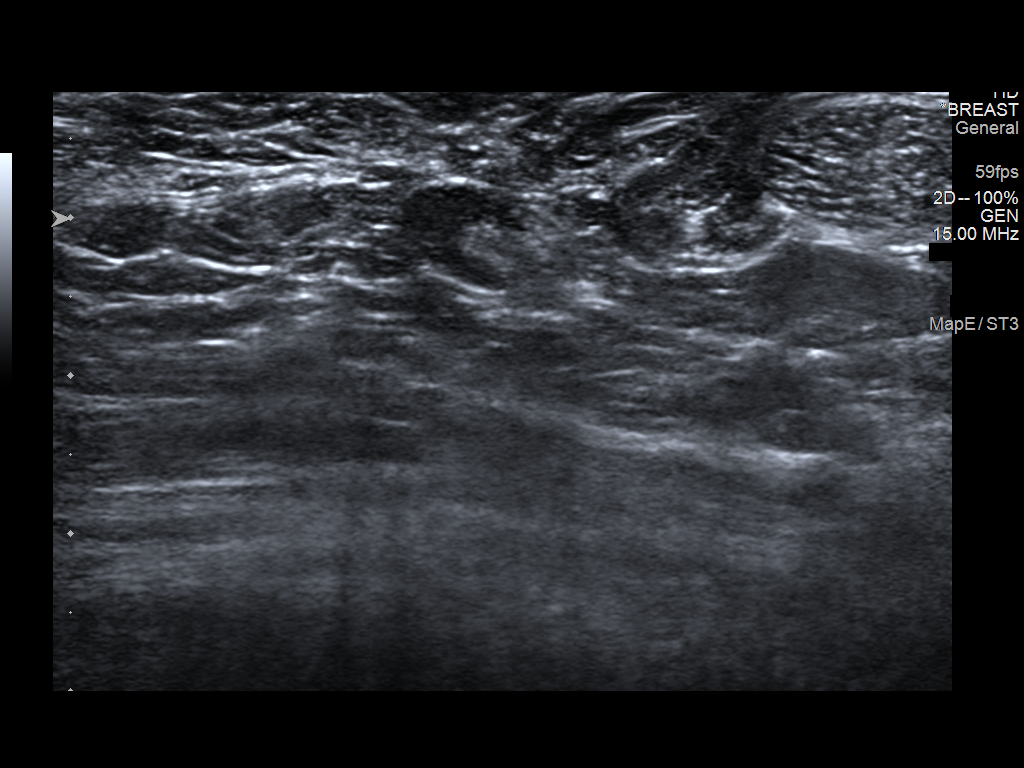
[im 4/4]
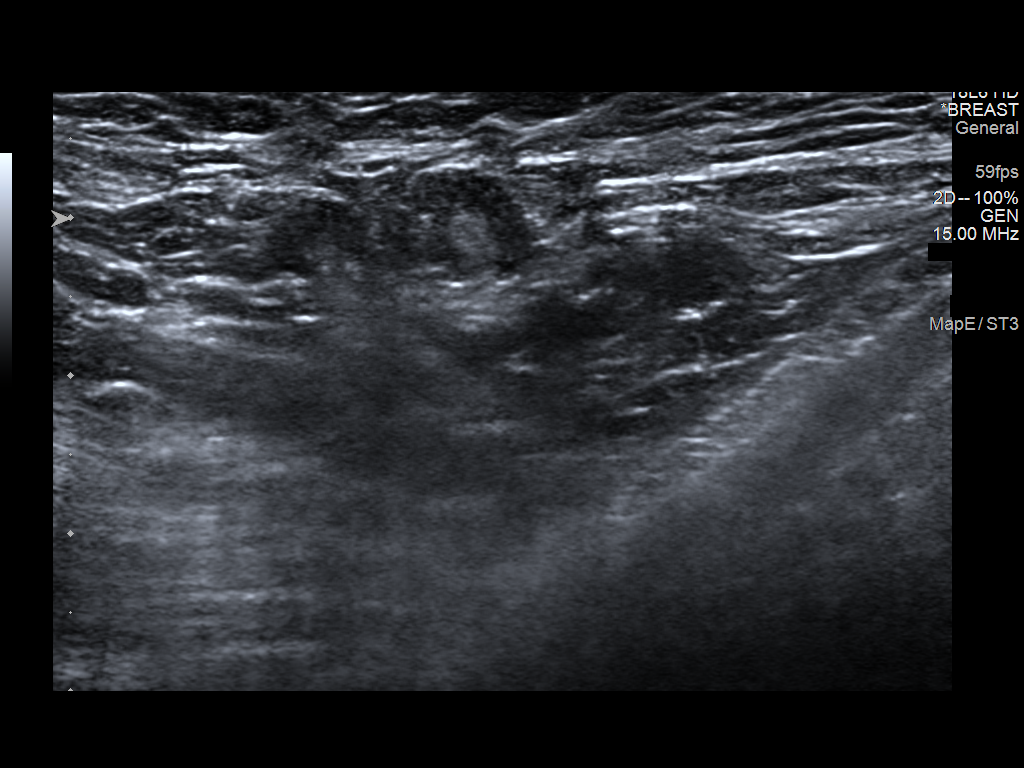

[4 of 4 positions shown; findings below may reference images not displayed]

ACR Breast Density Category c: The breast tissue is heterogeneously
dense, which may obscure small masses.
FINDINGS: A BB has been placed at the palpable site of concern in the
upper-outer quadrant of the right breast. No suspicious mammographic
findings are identified deep to this palpable marker. There is an
asymmetry in the lower outer quadrant of the right breast which
effaces on spot compression tomosynthesis images consistent with
overlapping fibroglandular tissue. No suspicious calcifications,
masses or areas of distortion are seen in the bilateral breasts.

Mammographic images were processed with CAD.

On physical exam, no palpable masses are identified at the site of
focal pain in the upper-outer right breast extending into the
axilla.

Ultrasound in the upper outer quadrant of the right breast
demonstrates normal fibroglandular tissue. No masses or suspicious
areas of shadowing are identified. Ultrasound of the right axilla
demonstrates multiple normal-appearing lymph nodes. Again, no masses
are identified.
IMPRESSION: 1. There are no mammographic or targeted sonographic abnormalities
at the site of focal pain in the upper-outer right breast.

2.  No mammographic evidence of malignancy in the bilateral breasts.

RECOMMENDATION:
1. Clinical follow-up recommended for the tender area of concern in
the upper-outer right breast extending into the axilla. Any further
workup should be based on clinical grounds.

2.  Screening mammogram in one year.(Code:57-P-3QK)

I have discussed the findings and recommendations with the patient.
Results were also provided in writing at the conclusion of the
visit. If applicable, a reminder letter will be sent to the patient
regarding the next appointment.

BI-RADS CATEGORY  1: Negative.

## 2020-10-08 IMAGING — MG DIGITAL DIAGNOSTIC BILATERAL MAMMOGRAM WITH TOMO AND CAD
8 of 14 series · 8 of 40 positions shown · non-contrast
Comparison: Previous exam(s).

CLINICAL DATA: 52-year-old female presenting for evaluation of
diffuse intermittent shooting pain throughout the right breast for 2
weeks. More recently she began have focal pain in the upper-outer
quadrant of the right breast.

EXAM:
DIGITAL DIAGNOSTIC BILATERAL MAMMOGRAM WITH CAD AND TOMO
ULTRASOUND RIGHT BREAST

[R MLO synth-2D (1 of 2)]
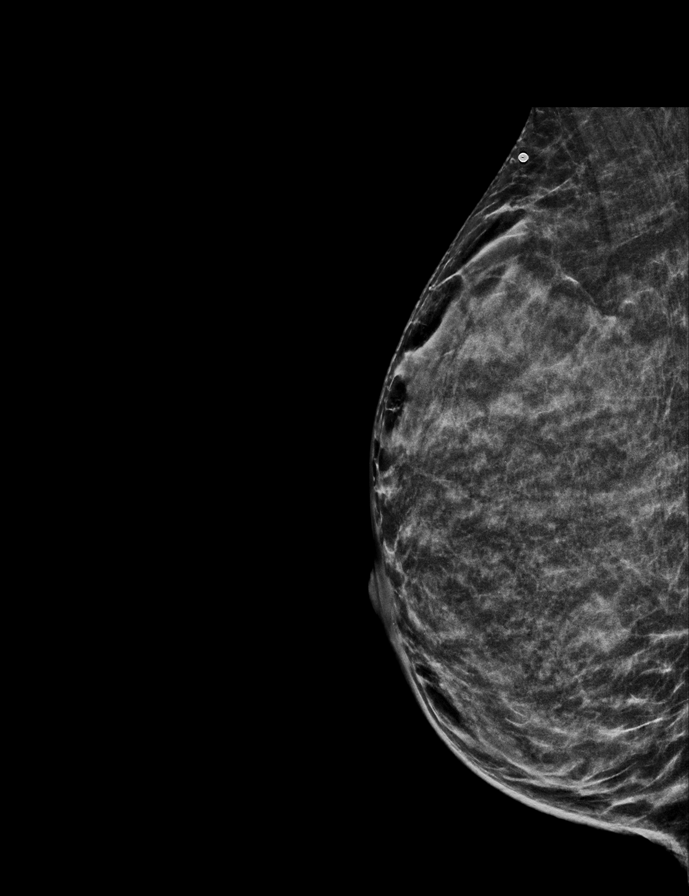

[R CC synth-2D (1 of 2)]
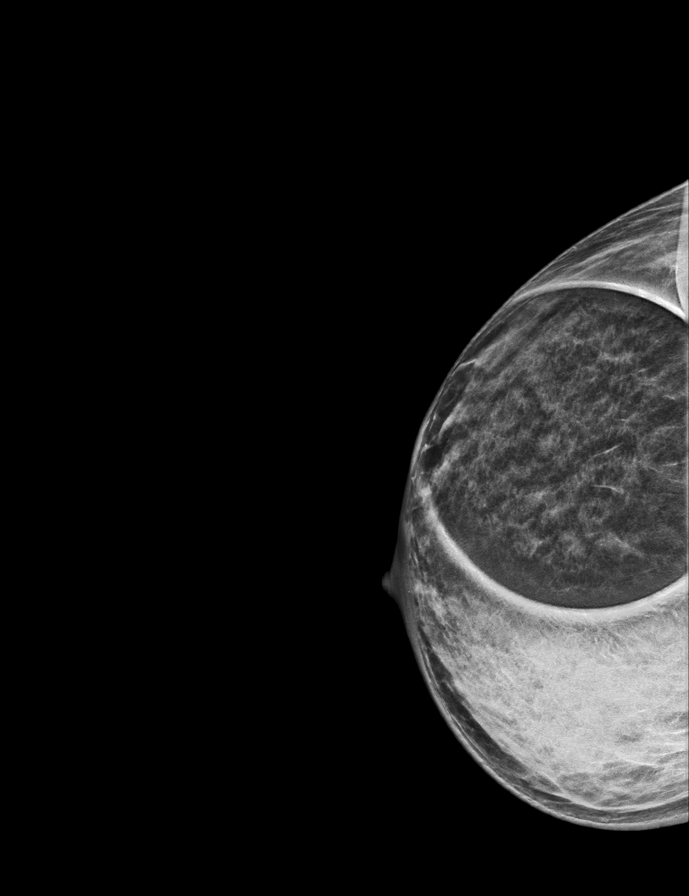

[R CC synth-2D (2 of 2)]
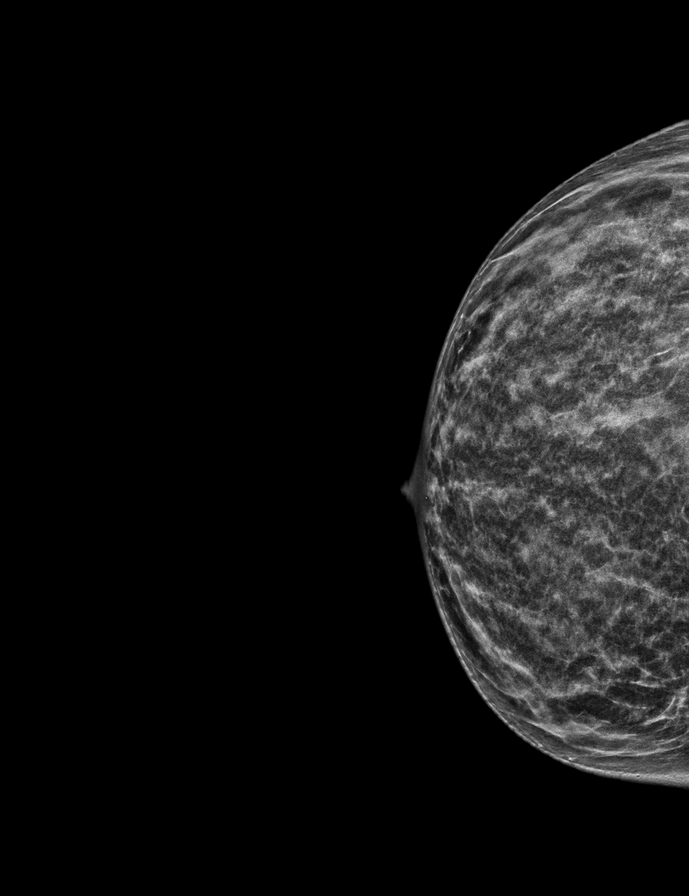

[R TAN synth-2D]
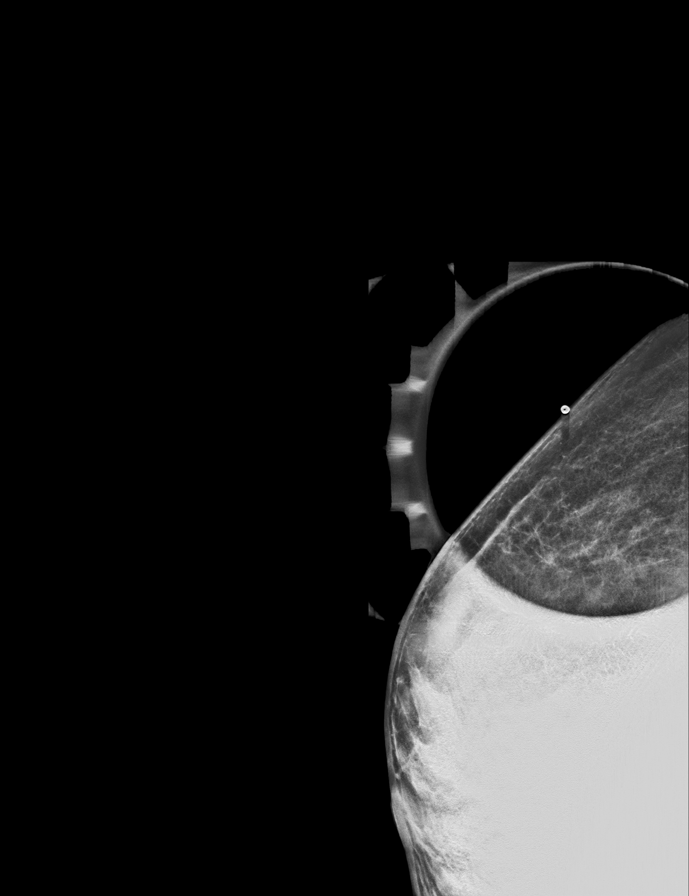

[R MLO synth-2D (2 of 2)]
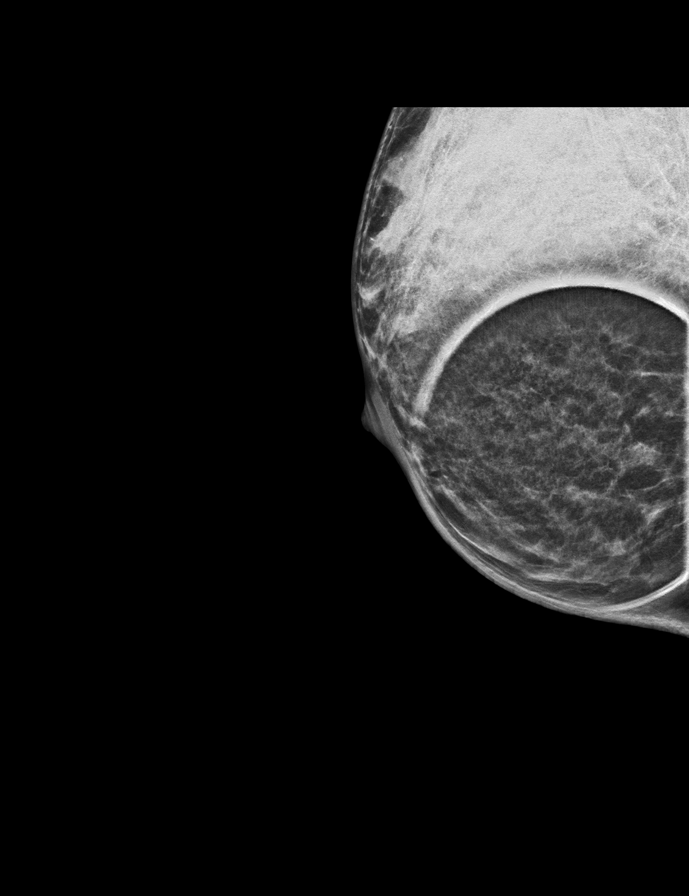

[L MLO synth-2D]
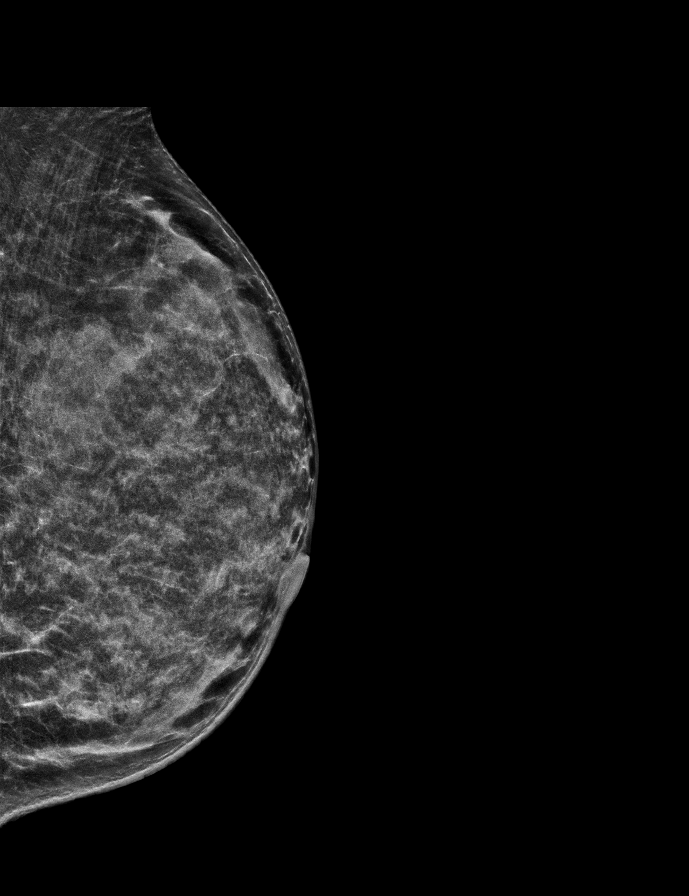

[L CC synth-2D]
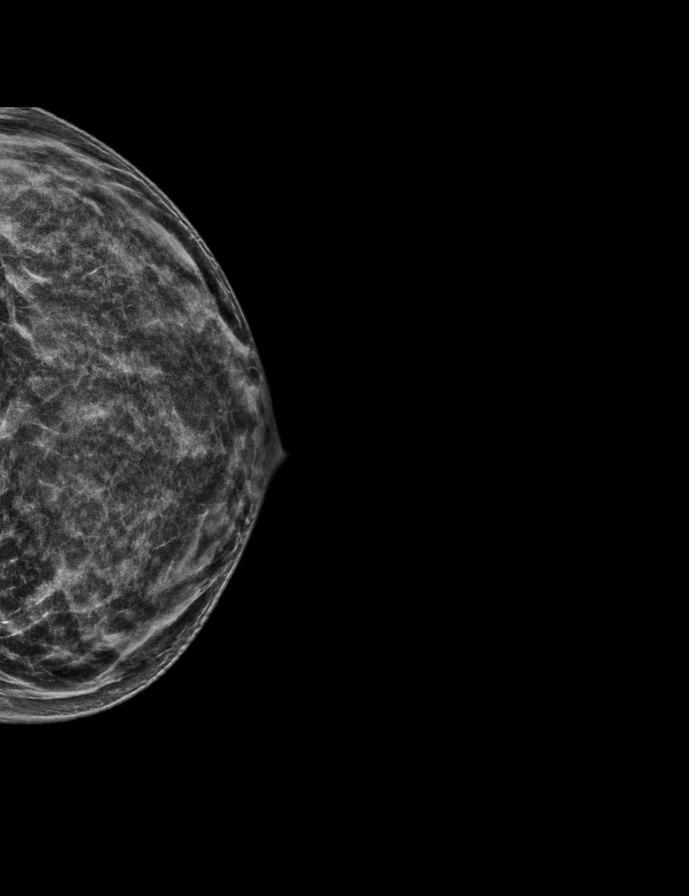

[R TAN tomo · tomo slice 19/38.0]
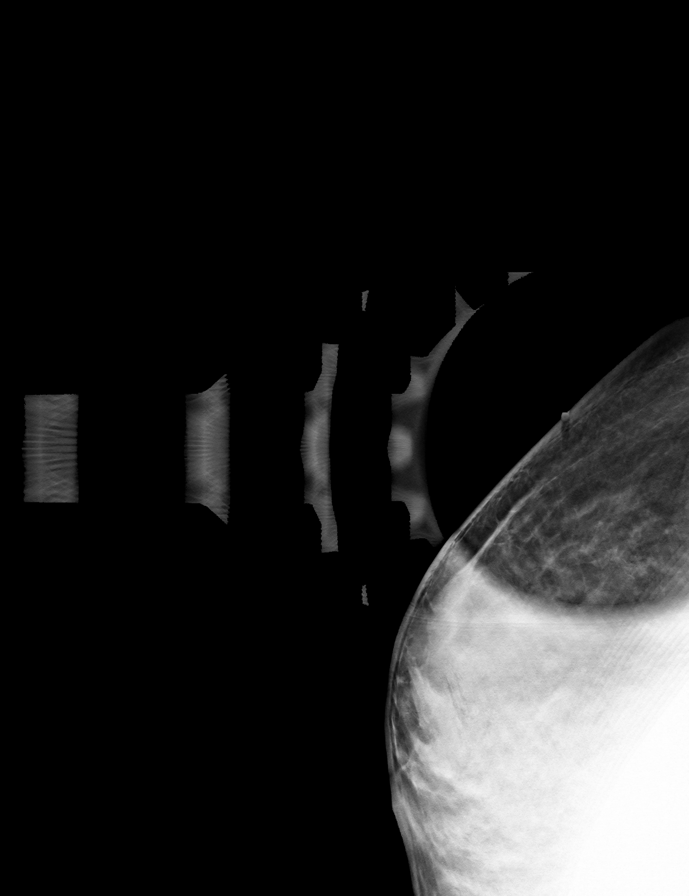

[8 of 40 positions shown; findings below may reference images not displayed]

ACR Breast Density Category c: The breast tissue is heterogeneously
dense, which may obscure small masses.
FINDINGS: A BB has been placed at the palpable site of concern in the
upper-outer quadrant of the right breast. No suspicious mammographic
findings are identified deep to this palpable marker. There is an
asymmetry in the lower outer quadrant of the right breast which
effaces on spot compression tomosynthesis images consistent with
overlapping fibroglandular tissue. No suspicious calcifications,
masses or areas of distortion are seen in the bilateral breasts.

Mammographic images were processed with CAD.

On physical exam, no palpable masses are identified at the site of
focal pain in the upper-outer right breast extending into the
axilla.

Ultrasound in the upper outer quadrant of the right breast
demonstrates normal fibroglandular tissue. No masses or suspicious
areas of shadowing are identified. Ultrasound of the right axilla
demonstrates multiple normal-appearing lymph nodes. Again, no masses
are identified.
IMPRESSION: 1. There are no mammographic or targeted sonographic abnormalities
at the site of focal pain in the upper-outer right breast.

2.  No mammographic evidence of malignancy in the bilateral breasts.

RECOMMENDATION:
1. Clinical follow-up recommended for the tender area of concern in
the upper-outer right breast extending into the axilla. Any further
workup should be based on clinical grounds.

2.  Screening mammogram in one year.(Code:57-P-3QK)

I have discussed the findings and recommendations with the patient.
Results were also provided in writing at the conclusion of the
visit. If applicable, a reminder letter will be sent to the patient
regarding the next appointment.

BI-RADS CATEGORY  1: Negative.

## 2020-11-09 ENCOUNTER — Other Ambulatory Visit: Payer: Self-pay | Admitting: Family Medicine

## 2020-12-05 ENCOUNTER — Other Ambulatory Visit: Payer: Self-pay | Admitting: Family Medicine

## 2020-12-05 NOTE — Telephone Encounter (Signed)
Please call and schedule CPE with fasting labs prior with Dr. Bedsole. 

## 2020-12-05 NOTE — Telephone Encounter (Signed)
Left message for patient to call the office

## 2020-12-13 NOTE — Telephone Encounter (Signed)
Left message for pt to call the office.

## 2020-12-27 NOTE — Telephone Encounter (Signed)
Left message for pt call the office

## 2021-01-08 ENCOUNTER — Other Ambulatory Visit: Payer: Self-pay | Admitting: Family Medicine

## 2021-01-19 ENCOUNTER — Other Ambulatory Visit: Payer: Self-pay

## 2021-01-21 MED ORDER — ZOLOFT 25 MG PO TABS: 25.0000 mg | ORAL_TABLET | Freq: Every day | ORAL | 0 refills | Status: DC

## 2021-01-25 ENCOUNTER — Encounter: Payer: Self-pay | Admitting: Family Medicine

## 2021-01-25 ENCOUNTER — Other Ambulatory Visit: Payer: Self-pay

## 2021-01-25 ENCOUNTER — Ambulatory Visit: Payer: BC Managed Care – PPO | Admitting: Family Medicine

## 2021-01-25 VITALS — BP 140/84 | HR 66 | Temp 97.9°F | Ht 64.25 in | Wt 125.5 lb

## 2021-01-25 DIAGNOSIS — F331 Major depressive disorder, recurrent, moderate: Secondary | ICD-10-CM

## 2021-01-25 MED ORDER — ZOLOFT 25 MG PO TABS: 50.0000 mg | ORAL_TABLET | Freq: Every day | ORAL | 5 refills | Status: DC

## 2021-01-25 NOTE — Progress Notes (Signed)
Patient ID: Jackie Ford, female    DOB: 02/21/1966, 55 y.o.   MRN: [id number]  This visit was conducted in person.  BP 140/84   Pulse 66   Temp 97.9 F (36.6 C) (Temporal)   Ht 5' 4.25" (1.632 m)   Wt 125 lb 8 oz (56.9 kg)   LMP 05/11/2015 (Approximate)   SpO2 97%   BMI 21.37 kg/m    CC:  Chief Complaint  Patient presents with  . Medication Refill    Zoloft    Subjective:   HPI: Jackie Ford is a 55 y.o. female presenting on 01/25/2021 for Medication Refill (Zoloft)    MDD, OCP, agoraphobia:  On zoloft 25 g daily  She reports mood is well controlled on zoloft.  She still has issues sleeping and therefore feels tired. Feels worried at night because feels her guard is down... this is a long time issue.   Some increase in stress.   Father with CVA and COVID.      Relevant past medical, surgical, family and social history reviewed and updated as indicated. Interim medical history since our last visit reviewed. Allergies and medications reviewed and updated. Outpatient Medications Prior to Visit  Medication Sig Dispense Refill  . Glucosamine-Chondroitin-Vit C 2000-1200-60 MG/30ML LIQD Take 10 mLs by mouth daily.    Marland Kitchen Hyaluronic Acid-Vitamin C (HYALURONIC ACID PO) Take 1 capsule by mouth daily.    . Multiple Vitamins-Minerals (HAIR/SKIN/NAILS) TABS Take 1 tablet by mouth daily.    . NON FORMULARY Take 1 each by mouth daily. Beauty Bursts Gourmet Collagen Soft Chews    . Probiotic Product (PROBIOTIC DAILY PO) Take 1 capsule by mouth daily.    . Saw Palmetto, Serenoa repens, 320 MG CAPS Take 1 capsule by mouth daily.    Marland Kitchen ZOLOFT 25 MG tablet Take 1 tablet (25 mg total) by mouth daily. 30 tablet 0   No facility-administered medications prior to visit.     Per HPI unless specifically indicated in ROS section below Review of Systems  Constitutional: Negative for fatigue and fever.  HENT: Negative for congestion.   Eyes: Negative for pain.  Respiratory: Negative  for cough and shortness of breath.   Cardiovascular: Negative for chest pain, palpitations and leg swelling.  Gastrointestinal: Negative for abdominal pain.  Genitourinary: Negative for dysuria and vaginal bleeding.  Musculoskeletal: Negative for back pain.  Neurological: Negative for syncope, light-headedness and headaches.  Psychiatric/Behavioral: Negative for dysphoric mood.   Objective:  BP 140/84   Pulse 66   Temp 97.9 F (36.6 C) (Temporal)   Ht 5' 4.25" (1.632 m)   Wt 125 lb 8 oz (56.9 kg)   LMP 05/11/2015 (Approximate)   SpO2 97%   BMI 21.37 kg/m   Wt Readings from Last 3 Encounters:  01/25/21 125 lb 8 oz (56.9 kg)  10/11/19 115 lb 12 oz (52.5 kg)  03/29/19 112 lb (50.8 kg)      Physical Exam Constitutional:      General: She is not in acute distress.    Appearance: Normal appearance. She is well-developed. She is not ill-appearing or toxic-appearing.  HENT:     Head: Normocephalic.     Right Ear: Hearing, tympanic membrane, ear canal and external ear normal. Tympanic membrane is not erythematous, retracted or bulging.     Left Ear: Hearing, tympanic membrane, ear canal and external ear normal. Tympanic membrane is not erythematous, retracted or bulging.     Nose: No mucosal edema or  rhinorrhea.     Right Sinus: No maxillary sinus tenderness or frontal sinus tenderness.     Left Sinus: No maxillary sinus tenderness or frontal sinus tenderness.     Mouth/Throat:     Pharynx: Uvula midline.  Eyes:     General: Lids are normal. Lids are everted, no foreign bodies appreciated.     Conjunctiva/sclera: Conjunctivae normal.     Pupils: Pupils are equal, round, and reactive to light.  Neck:     Thyroid: No thyroid mass or thyromegaly.     Vascular: No carotid bruit.     Trachea: Trachea normal.  Cardiovascular:     Rate and Rhythm: Normal rate and regular rhythm.     Pulses: Normal pulses.     Heart sounds: Normal heart sounds, S1 normal and S2 normal. No murmur  heard. No friction rub. No gallop.   Pulmonary:     Effort: Pulmonary effort is normal. No tachypnea or respiratory distress.     Breath sounds: Normal breath sounds. No decreased breath sounds, wheezing, rhonchi or rales.  Abdominal:     General: Bowel sounds are normal.     Palpations: Abdomen is soft.     Tenderness: There is no abdominal tenderness.  Musculoskeletal:     Cervical back: Normal range of motion and neck supple.  Skin:    General: Skin is warm and dry.     Findings: No rash.  Neurological:     Mental Status: She is alert.  Psychiatric:        Mood and Affect: Mood is not anxious or depressed.        Speech: Speech normal.        Behavior: Behavior normal. Behavior is cooperative.        Thought Content: Thought content normal.        Judgment: Judgment normal.       Results for orders placed or performed in visit on 07/18/19  Comprehensive metabolic panel  Result Value Ref Range   Sodium 142 135 - 145 mEq/L   Potassium 3.9 3.5 - 5.1 mEq/L   Chloride 105 96 - 112 mEq/L   CO2 29 19 - 32 mEq/L   Glucose, Bld 93 70 - 99 mg/dL   BUN 8 6 - 23 mg/dL   Creatinine, Ser 0.62 0.40 - 1.20 mg/dL   Total Bilirubin 0.6 0.2 - 1.2 mg/dL   Alkaline Phosphatase 99 39 - 117 U/L   AST 20 0 - 37 U/L   ALT 13 0 - 35 U/L   Total Protein 6.8 6.0 - 8.3 g/dL   Albumin 4.3 3.5 - 5.2 g/dL   Calcium 9.4 8.4 - 10.5 mg/dL   GFR 99.91 >60.00 mL/min  Lipid panel  Result Value Ref Range   Cholesterol 183 0 - 200 mg/dL   Triglycerides 91.0 0.0 - 149.0 mg/dL   HDL 56.10 >39.00 mg/dL   VLDL 18.2 0.0 - 40.0 mg/dL   LDL Cholesterol 109 (H) 0 - 99 mg/dL   Total CHOL/HDL Ratio 3    NonHDL 127.38     This visit occurred during the SARS-CoV-2 public health emergency.  Safety protocols were in place, including screening questions prior to the visit, additional usage of staff PPE, and extensive cleaning of exam room while observing appropriate contact time as indicated for disinfecting  solutions.   COVID 19 screen:  No recent travel or known exposure to COVID19 The patient denies respiratory symptoms of COVID 19 at this time. The  importance of social distancing was discussed today.   Assessment and Plan Problem List Items Addressed This Visit    Depression, major, recurrent, moderate ([facility]) - Primary    Stable, chronic.  Continue current medication.   Zoloft 25 mg daily           Eliezer Lofts, MD

## 2021-01-28 DIAGNOSIS — F331 Major depressive disorder, recurrent, moderate: Secondary | ICD-10-CM

## 2021-01-28 MED ORDER — ZOLOFT 25 MG PO TABS: 50.0000 mg | ORAL_TABLET | Freq: Every day | ORAL | 5 refills | Status: DC

## 2021-01-29 ENCOUNTER — Other Ambulatory Visit: Payer: Self-pay | Admitting: Family Medicine

## 2021-01-29 DIAGNOSIS — F331 Major depressive disorder, recurrent, moderate: Secondary | ICD-10-CM

## 2021-02-01 ENCOUNTER — Telehealth: Payer: Self-pay | Admitting: *Deleted

## 2021-02-01 ENCOUNTER — Other Ambulatory Visit: Payer: Self-pay | Admitting: Family Medicine

## 2021-02-01 DIAGNOSIS — F331 Major depressive disorder, recurrent, moderate: Secondary | ICD-10-CM

## 2021-02-01 NOTE — Telephone Encounter (Signed)
Received MyChart message for Mrs. Mckiddy stating the pharmacy informed her that a PA needed to be done for patient's Zoloft.  PA completed on CoverMyMeds and sent to [facility] for review.  It can take up to 72 hours for a decision.

## 2021-02-04 NOTE — Telephone Encounter (Signed)
PA approved from 02/02/2021 through 02/02/2022.  [facility] notified of approval via fax.

## 2021-03-07 NOTE — Assessment & Plan Note (Signed)
Stable, chronic.  Continue current medication.   Zoloft 25 mg daily

## 2021-04-26 ENCOUNTER — Encounter: Payer: Self-pay | Admitting: Family Medicine

## 2021-04-26 ENCOUNTER — Other Ambulatory Visit: Payer: Self-pay

## 2021-04-26 ENCOUNTER — Telehealth (INDEPENDENT_AMBULATORY_CARE_PROVIDER_SITE_OTHER): Payer: BC Managed Care – PPO | Admitting: Family Medicine

## 2021-04-26 DIAGNOSIS — F331 Major depressive disorder, recurrent, moderate: Secondary | ICD-10-CM | POA: Diagnosis not present

## 2021-04-26 DIAGNOSIS — F422 Mixed obsessional thoughts and acts: Secondary | ICD-10-CM | POA: Diagnosis not present

## 2021-04-26 DIAGNOSIS — F4000 Agoraphobia, unspecified: Secondary | ICD-10-CM

## 2021-04-26 DIAGNOSIS — F410 Panic disorder [episodic paroxysmal anxiety] without agoraphobia: Secondary | ICD-10-CM

## 2021-04-26 DIAGNOSIS — Z1211 Encounter for screening for malignant neoplasm of colon: Secondary | ICD-10-CM

## 2021-04-26 DIAGNOSIS — Z800 Family history of malignant neoplasm of digestive organs: Secondary | ICD-10-CM

## 2021-04-26 NOTE — Assessment & Plan Note (Signed)
The fear of public and resulting panic attacks is the main issue tha limits her from being able to serve on a jury.   Letter to postpone jury service written.

## 2021-04-26 NOTE — Assessment & Plan Note (Signed)
Recent new Dx for father.  She last did stool cards in 2017... will refer for colonoscopy.

## 2021-04-26 NOTE — Assessment & Plan Note (Signed)
Well controlled on sertlraline.

## 2021-04-26 NOTE — Progress Notes (Signed)
Please send letter to appropriate location... see scanned juror summons note on  MyChart.

## 2021-04-26 NOTE — Progress Notes (Signed)
VIRTUAL VISIT Due to national recommendations of social distancing due to [place] 19, a virtual visit is felt to be most appropriate for this patient at this time.   I connected withthe patient on 04/26/21 at  2:40 PM EDT by virtual telehealth platform and verified that I am speaking with the correct person using two identifiers.  I discussed the limitations, risks, security and privacy concerns of performing an evaluation and management service by  virtual telehealth platform and the availability of in person appointments. I also discussed with the patient that there may be a patient responsible charge related to this service. The patient expressed understanding and agreed to proceed.  Patient location: Home Provider Location: [facility] Participants: Eliezer Lofts and Davonna Belling       Chief Complaint  Patient presents with  . Letter for School/Work    Excused from jury duty     History of Present Illness:  55 year old female presents with  MDD, generalized anxiety, panic attacks, obsessive compulsive disorder and agoraphobia that restricts her for performing her jury duty. Juror # [id number]  Date Jury Duty: 06/24/2021  She reports the zoloft 50 mg dilly  Helps her symptoms but she is still not able to go into a courtroom and remain around other individuals without having a panic attack.   She feels that the increased dose of zoloft has helped significantly She is under a lot of stress father had CVA, and was dx with colon cancer  She has fear that are excessive about if a criminal sees her face " I will die". She feels panicky, cannot breathe.  Couldn't make good decision as a jury duty   COVID 19 screen No recent travel or known exposure to [place] The patient denies respiratory symptoms of COVID 19 at this time. The importance of social distancing was discussed today.   ROS       Past Medical History:  Diagnosis Date  . Bronchitis     reports  that she has quit smoking. She has never used smokeless tobacco. She reports that she does not drink alcohol and does not use drugs.   Current Outpatient Medications:  Marland Kitchen  Glucosamine-Chondroitin-Vit C 2000-1200-60 MG/30ML LIQD, Take 10 mLs by mouth daily., Disp: , Rfl:  .  Hyaluronic Acid-Vitamin C (HYALURONIC ACID PO), Take 1 capsule by mouth daily., Disp: , Rfl:  .  Multiple Vitamins-Minerals (HAIR/SKIN/NAILS) TABS, Take 1 tablet by mouth daily., Disp: , Rfl:  .  NON FORMULARY, Take 1 each by mouth daily. Beauty Bursts Gourmet Collagen Soft Chews, Disp: , Rfl:  .  Probiotic Product (PROBIOTIC DAILY PO), Take 1 capsule by mouth daily., Disp: , Rfl:  .  Saw Palmetto, Serenoa repens, 320 MG CAPS, Take 1 capsule by mouth daily., Disp: , Rfl:  .  ZOLOFT 25 MG tablet, Take 2 tablets (50 mg total) by mouth daily., Disp: 60 tablet, Rfl: 5   Observations/Objective: Last menstrual period 05/11/2015.  Physical Exam  Physical Exam Constitutional:      General: The patient is not in acute distress. Pulmonary:     Effort: Pulmonary effort is normal. No respiratory distress.  Neurological:     Mental Status: The patient is alert and oriented to person, place, and time.  Psychiatric:        Mood and Affect: Mood normal.        Behavior: Behavior normal.  Assessment and Plan   I discussed the assessment and treatment plan with the  patient. The patient was provided an opportunity to ask questions and all were answered. The patient agreed with the plan and demonstrated an understanding of the instructions.  The patient was advised to call back or seek an in-person evaluation if the symptoms worsen or if the condition fails to improve as anticipated.    Problem List Items Addressed This Visit    Agoraphobia    The fear of public and resulting panic attacks is the main issue tha limits her from being able to serve on a jury.   Letter to postpone jury service written.      Depression,  major, recurrent, moderate ([place]) - Primary    Well controlled on sertlraline.      Family history of colon cancer in father    Recent new Dx for father.  She last did stool cards in 2017... will refer for colonoscopy.      Obsessive compulsive disorder   Panic attacks    Other Visit Diagnoses    Colon cancer screening           Eliezer Lofts, MD

## 2021-06-26 ENCOUNTER — Telehealth: Payer: Self-pay

## 2021-06-26 ENCOUNTER — Telehealth: Payer: BC Managed Care – PPO | Admitting: Nurse Practitioner

## 2021-06-26 DIAGNOSIS — U071 COVID-19: Secondary | ICD-10-CM

## 2021-06-26 MED ORDER — BENZONATATE 100 MG PO CAPS: 100.0000 mg | ORAL_CAPSULE | Freq: Three times a day (TID) | ORAL | 0 refills | Status: DC | PRN

## 2021-06-26 MED ORDER — MOLNUPIRAVIR EUA 200MG CAPSULE: 4.0000 | ORAL_CAPSULE | Freq: Two times a day (BID) | ORAL | 0 refills | Status: AC

## 2021-06-26 MED ORDER — NAPROXEN 500 MG PO TABS: 500.0000 mg | ORAL_TABLET | Freq: Two times a day (BID) | ORAL | 1 refills | Status: DC

## 2021-06-26 NOTE — Telephone Encounter (Signed)
Pt's husband left a message on triage asking what pt should do. Tested positive for Covid this am. Fever 102 yesterday. Lost taste this am. She is not vaccinated. Also having body aches. Says worse than the flu. I advised them Dr Diona Browner is out of the office today and I am not sure when she will be checking her inbox. But, to get Paxlovid, she would need to be seen by someone and we do not have any availability. I gave them info on [place] and [place]. Will still send message to Dr Diona Browner as Juluis Rainier.
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
## 2021-06-26 NOTE — Patient Instructions (Signed)
You are being prescribed MOLNUPIRAVIR for COVID-19 infection.     Please pick [facility] your [facility] at: [facility] called into [place]   Please call the pharmacy or go through the drive through vs going inside if you are picking up the mediation yourself to prevent further spread. If prescribed to a [facility] affiliated pharmacy, a pharmacist will bring the medication out to your car.   ADMINISTRATION INSTRUCTIONS: Take with or without food. Swallow the tablets whole. Don't chew, crush, or break the medications because it might not work as well  For each dose of the medication, you should be taking FOUR tablets at one time, TWICE a day   Finish your full five-day course of Molnupiravir even if you feel better before you're done. Stopping this medication too early can make it less effective to prevent severe illness related to [place].    Molnupiravir is prescribed for YOU ONLY. Don't share it with others, even if they have similar symptoms as you. This medication might not be right for everyone.   Make sure to take steps to protect yourself and others while you're taking this medication in order to get well soon and to prevent others from getting sick with COVID-19.   **If you are of childbearing potential (any gender) - it is advised to not get pregnant while taking this medication and recommended that condoms are used for female partners the next 3 months after taking the medication out of extreme caution    COMMON SIDE EFFECTS: Diarrhea Nausea  Dizziness    If your COVID-19 symptoms get worse, get medical help right away. Call 911 if you experience symptoms such as worsening cough, trouble breathing, chest pain that doesn't go away, confusion, a hard time staying awake, and pale or blue-colored skin. This medication won't prevent all COVID-19 cases from getting worse.

## 2021-06-27 NOTE — Telephone Encounter (Signed)
Jackie Ford did a Video Visit yesterday with Mary-Margaret Hassell Done, FNP.

## 2021-07-19 ENCOUNTER — Ambulatory Visit: Payer: BC Managed Care – PPO | Admitting: Family Medicine

## 2021-07-25 ENCOUNTER — Other Ambulatory Visit: Payer: Self-pay

## 2021-07-26 ENCOUNTER — Ambulatory Visit: Payer: BC Managed Care – PPO | Admitting: Family Medicine

## 2021-07-26 ENCOUNTER — Encounter: Payer: Self-pay | Admitting: Family Medicine

## 2021-07-26 VITALS — BP 130/74 | HR 61 | Temp 98.2°F | Ht 64.25 in | Wt 119.5 lb

## 2021-07-26 DIAGNOSIS — L989 Disorder of the skin and subcutaneous tissue, unspecified: Secondary | ICD-10-CM

## 2021-07-26 NOTE — Progress Notes (Signed)
Patient ID: Jackie Ford, female    DOB: 13-Jun-1966, 55 y.o.   MRN: [id number]  This visit was conducted in person.  BP 130/74   Pulse 61   Temp 98.2 F (36.8 C) (Temporal)   Ht 5' 4.25" (1.632 m)   Wt 119 lb 8 oz (54.2 kg)   LMP 05/11/2015 (Approximate)   SpO2 98%   BMI 20.35 kg/m    CC:  nodule on neck left side of neck   Subjective:   HPI: Jackie Ford is a 55 y.o. female presenting on 07/26/2021 for No chief complaint on file.  She has noted 6 weeks ago, 2 weeks prior to covid.Marland Kitchen note swelling in left neck, hard nodule.  No pain, no redness.  She picks a it some... gets irritated then.  No bleeding.  Getting very slightly bigger.  Feels well overall. No fever. She has gotten over COVID but has mild fatigue.     Relevant past medical, surgical, family and social history reviewed and updated as indicated. Interim medical history since our last visit reviewed. Allergies and medications reviewed and updated. Outpatient Medications Prior to Visit  Medication Sig Dispense Refill   Glucosamine-Chondroitin-Vit C 2000-1200-60 MG/30ML LIQD Take 10 mLs by mouth daily.     Hyaluronic Acid-Vitamin C (HYALURONIC ACID PO) Take 1 capsule by mouth daily.     Multiple Vitamins-Minerals (HAIR/SKIN/NAILS) TABS Take 1 tablet by mouth daily.     NON FORMULARY Take 1 each by mouth daily. Beauty Bursts Gourmet Collagen Soft Chews     OVER THE COUNTER MEDICATION Serovital-4 tablets by mouth daily     Probiotic Product (PROBIOTIC DAILY PO) Take 1 capsule by mouth daily.     Saw Palmetto, Serenoa repens, 320 MG CAPS Take 1 capsule by mouth daily.     ZOLOFT 25 MG tablet Take 2 tablets (50 mg total) by mouth daily. 60 tablet 5   benzonatate (TESSALON PERLES) 100 MG capsule Take 1 capsule (100 mg total) by mouth 3 (three) times daily as needed. 20 capsule 0   naproxen (NAPROSYN) 500 MG tablet Take 1 tablet (500 mg total) by mouth 2 (two) times daily with a meal. 60 tablet 1   No  facility-administered medications prior to visit.     Per HPI unless specifically indicated in ROS section below Review of Systems  Constitutional:  Negative for fatigue and fever.  HENT:  Negative for congestion.   Eyes:  Negative for pain.  Respiratory:  Negative for cough and shortness of breath.   Cardiovascular:  Negative for chest pain, palpitations and leg swelling.  Gastrointestinal:  Negative for abdominal pain.  Genitourinary:  Negative for dysuria and vaginal bleeding.  Musculoskeletal:  Negative for back pain.  Neurological:  Negative for syncope, light-headedness and headaches.  Psychiatric/Behavioral:  Negative for dysphoric mood.   Objective:  BP 130/74   Pulse 61   Temp 98.2 F (36.8 C) (Temporal)   Ht 5' 4.25" (1.632 m)   Wt 119 lb 8 oz (54.2 kg)   LMP 05/11/2015 (Approximate)   SpO2 98%   BMI 20.35 kg/m   Wt Readings from Last 3 Encounters:  07/26/21 119 lb 8 oz (54.2 kg)  01/25/21 125 lb 8 oz (56.9 kg)  10/11/19 115 lb 12 oz (52.5 kg)      Physical Exam Constitutional:      General: She is not in acute distress.    Appearance: Normal appearance. She is well-developed. She is not ill-appearing or  toxic-appearing.  HENT:     Head: Normocephalic.     Right Ear: Hearing, tympanic membrane, ear canal and external ear normal. Tympanic membrane is not erythematous, retracted or bulging.     Left Ear: Hearing, tympanic membrane, ear canal and external ear normal. Tympanic membrane is not erythematous, retracted or bulging.     Nose: No mucosal edema or rhinorrhea.     Right Sinus: No maxillary sinus tenderness or frontal sinus tenderness.     Left Sinus: No maxillary sinus tenderness or frontal sinus tenderness.     Mouth/Throat:     Pharynx: Uvula midline.  Eyes:     General: Lids are normal. Lids are everted, no foreign bodies appreciated.     Conjunctiva/sclera: Conjunctivae normal.     Pupils: Pupils are equal, round, and reactive to light.  Neck:      Thyroid: No thyroid mass or thyromegaly.     Vascular: No carotid bruit.     Trachea: Trachea normal.  Cardiovascular:     Rate and Rhythm: Normal rate and regular rhythm.     Pulses: Normal pulses.     Heart sounds: Normal heart sounds, S1 normal and S2 normal. No murmur heard.   No friction rub. No gallop.  Pulmonary:     Effort: Pulmonary effort is normal. No tachypnea or respiratory distress.     Breath sounds: Normal breath sounds. No decreased breath sounds, wheezing, rhonchi or rales.  Abdominal:     General: Bowel sounds are normal.     Palpations: Abdomen is soft.     Tenderness: There is no abdominal tenderness.  Musculoskeletal:     Cervical back: Normal range of motion and neck supple.  Skin:    General: Skin is warm and dry.     Findings: No rash.          Comments: Small mobile nodule  about 5 mm in diameter in right neck with hyperpigmentation and scab overlying the lesion   No lymphadenopathy or thyroid enlargement  Neurological:     Mental Status: She is alert.  Psychiatric:        Mood and Affect: Mood is not anxious or depressed.        Speech: Speech normal.        Behavior: Behavior normal. Behavior is cooperative.        Thought Content: Thought content normal.        Judgment: Judgment normal.      Results for orders placed or performed in visit on 07/18/19  Comprehensive metabolic panel  Result Value Ref Range   Sodium 142 135 - 145 mEq/L   Potassium 3.9 3.5 - 5.1 mEq/L   Chloride 105 96 - 112 mEq/L   CO2 29 19 - 32 mEq/L   Glucose, Bld 93 70 - 99 mg/dL   BUN 8 6 - 23 mg/dL   Creatinine, Ser 0.62 0.40 - 1.20 mg/dL   Total Bilirubin 0.6 0.2 - 1.2 mg/dL   Alkaline Phosphatase 99 39 - 117 U/L   AST 20 0 - 37 U/L   ALT 13 0 - 35 U/L   Total Protein 6.8 6.0 - 8.3 g/dL   Albumin 4.3 3.5 - 5.2 g/dL   Calcium 9.4 8.4 - 10.5 mg/dL   GFR 99.91 >60.00 mL/min  Lipid panel  Result Value Ref Range   Cholesterol 183 0 - 200 mg/dL   Triglycerides 91.0  0.0 - 149.0 mg/dL   HDL 56.10 >39.00 mg/dL  VLDL 18.2 0.0 - 40.0 mg/dL   LDL Cholesterol 109 (H) 0 - 99 mg/dL   Total CHOL/HDL Ratio 3    NonHDL 127.38     This visit occurred during the SARS-CoV-2 public health emergency.  Safety protocols were in place, including screening questions prior to the visit, additional usage of staff PPE, and extensive cleaning of exam room while observing appropriate contact time as indicated for disinfecting solutions.   COVID 19 screen:  No recent travel or known exposure to COVID19 The patient denies respiratory symptoms of COVID 19 at this time. The importance of social distancing was discussed today.   Assessment and Plan    Problem List Items Addressed This Visit     Skin lesion of neck - Primary    Likely sebaceous cyst but pt reports picking at it and  causing local skin irritation.  Can use the topical  OTC steroid like Cortisone 10 if area is irritated.        Eliezer Lofts, MD

## 2021-07-26 NOTE — Patient Instructions (Addendum)
Can use the topical  OTC steroid like Cortisone 10 if area is irritated.  Likely subcutaneous cyst.

## 2021-09-10 NOTE — Assessment & Plan Note (Signed)
Likely sebaceous cyst but pt reports picking at it and  causing local skin irritation.  Can use the topical  OTC steroid like Cortisone 10 if area is irritated.

## 2021-10-11 DIAGNOSIS — F331 Major depressive disorder, recurrent, moderate: Secondary | ICD-10-CM

## 2021-10-14 MED ORDER — ZOLOFT 25 MG PO TABS: 50.0000 mg | ORAL_TABLET | Freq: Every day | ORAL | 5 refills | Status: DC

## 2021-11-29 ENCOUNTER — Encounter: Payer: Self-pay | Admitting: Family Medicine

## 2021-12-06 ENCOUNTER — Encounter: Payer: Self-pay | Admitting: Family Medicine

## 2021-12-06 ENCOUNTER — Ambulatory Visit (INDEPENDENT_AMBULATORY_CARE_PROVIDER_SITE_OTHER): Payer: Self-pay | Admitting: Family Medicine

## 2021-12-06 ENCOUNTER — Other Ambulatory Visit: Payer: Self-pay

## 2021-12-06 VITALS — BP 130/80 | HR 76 | Temp 98.3°F | Ht 64.25 in | Wt 126.0 lb

## 2021-12-06 DIAGNOSIS — L853 Xerosis cutis: Secondary | ICD-10-CM

## 2021-12-06 DIAGNOSIS — F331 Major depressive disorder, recurrent, moderate: Secondary | ICD-10-CM

## 2021-12-06 MED ORDER — ZOLOFT 50 MG PO TABS: 50.0000 mg | ORAL_TABLET | Freq: Every day | ORAL | 0 refills | Status: DC

## 2021-12-06 NOTE — Assessment & Plan Note (Signed)
Treat area with topical hydrocortisone OTC BID x 2 weeks. Keep hands dry, decreased washing

## 2021-12-06 NOTE — Progress Notes (Signed)
Patient ID: Jackie Ford, female    DOB: February 27, 1966, 55 y.o.   MRN: [id number]  This visit was conducted in person.  BP 130/80   Pulse 76   Temp 98.3 F (36.8 C) (Temporal)   Ht 5' 4.25" (1.632 m)   Wt 126 lb (57.2 kg)   LMP 05/11/2015 (Approximate)   SpO2 98%   BMI 21.46 kg/m    CC:  Chief Complaint  Patient presents with   Discuss Zoloft    Subjective:   HPI: Jackie Ford is a 55 y.o. female presenting on 12/06/2021 for Discuss Zoloft     In last 2 months trouble with the cost name brand zoloft.. since 09/2021.  Insurance change in 09/2021.. then went back to [facility].. not sure why cost is now prohibitive.     She cannot tolerate the  generic version of zoloft as it cause her to be wobbly, shaky, jittery. Went to ER regarding this SE as it is severe. Negative evaluation.   Medication is     PHQ 9: 7   Also has dry patch on thumb.. washes hands many times a day > 40!  Relevant past medical, surgical, family and social history reviewed and updated as indicated. Interim medical history since our last visit reviewed. Allergies and medications reviewed and updated. Outpatient Medications Prior to Visit  Medication Sig Dispense Refill   Glucosamine-Chondroitin-Vit C 2000-1200-60 MG/30ML LIQD Take 10 mLs by mouth daily.     Hyaluronic Acid-Vitamin C (HYALURONIC ACID PO) Take 1 capsule by mouth daily.     Multiple Vitamins-Minerals (HAIR/SKIN/NAILS) TABS Take 1 tablet by mouth daily.     NON FORMULARY Take 1 each by mouth daily. Beauty Bursts Gourmet Collagen Soft Chews     OVER THE COUNTER MEDICATION Serovital-4 tablets by mouth daily     Probiotic Product (PROBIOTIC DAILY PO) Take 1 capsule by mouth daily.     Saw Palmetto, Serenoa repens, 320 MG CAPS Take 1 capsule by mouth daily.     ZOLOFT 25 MG tablet Take 2 tablets (50 mg total) by mouth daily. 60 tablet 5   No facility-administered medications prior to visit.     Per HPI unless specifically indicated in  ROS section below Review of Systems  Constitutional:  Negative for fatigue and fever.  HENT:  Negative for ear pain.   Eyes:  Negative for pain.  Respiratory:  Negative for chest tightness and shortness of breath.   Cardiovascular:  Negative for chest pain, palpitations and leg swelling.  Gastrointestinal:  Negative for abdominal pain.  Genitourinary:  Negative for dysuria.  Objective:  BP 130/80   Pulse 76   Temp 98.3 F (36.8 C) (Temporal)   Ht 5' 4.25" (1.632 m)   Wt 126 lb (57.2 kg)   LMP 05/11/2015 (Approximate)   SpO2 98%   BMI 21.46 kg/m   Wt Readings from Last 3 Encounters:  12/06/21 126 lb (57.2 kg)  07/26/21 119 lb 8 oz (54.2 kg)  01/25/21 125 lb 8 oz (56.9 kg)      Physical Exam Constitutional:      General: She is not in acute distress.    Appearance: Normal appearance. She is well-developed. She is not ill-appearing or toxic-appearing.  HENT:     Head: Normocephalic.     Right Ear: Hearing, tympanic membrane, ear canal and external ear normal. Tympanic membrane is not erythematous, retracted or bulging.     Left Ear: Hearing, tympanic membrane, ear canal  and external ear normal. Tympanic membrane is not erythematous, retracted or bulging.     Nose: No mucosal edema or rhinorrhea.     Right Sinus: No maxillary sinus tenderness or frontal sinus tenderness.     Left Sinus: No maxillary sinus tenderness or frontal sinus tenderness.     Mouth/Throat:     Pharynx: Uvula midline.  Eyes:     General: Lids are normal. Lids are everted, no foreign bodies appreciated.     Conjunctiva/sclera: Conjunctivae normal.     Pupils: Pupils are equal, round, and reactive to light.  Neck:     Thyroid: No thyroid mass or thyromegaly.     Vascular: No carotid bruit.     Trachea: Trachea normal.  Cardiovascular:     Rate and Rhythm: Normal rate and regular rhythm.     Pulses: Normal pulses.     Heart sounds: Normal heart sounds, S1 normal and S2 normal. No murmur heard.   No  friction rub. No gallop.  Pulmonary:     Effort: Pulmonary effort is normal. No tachypnea or respiratory distress.     Breath sounds: Normal breath sounds. No decreased breath sounds, wheezing, rhonchi or rales.  Abdominal:     General: Bowel sounds are normal.     Palpations: Abdomen is soft.     Tenderness: There is no abdominal tenderness.  Musculoskeletal:     Cervical back: Normal range of motion and neck supple.  Skin:    General: Skin is warm and dry.     Findings: No rash.  Neurological:     Mental Status: She is alert.  Psychiatric:        Mood and Affect: Mood is not anxious or depressed.        Speech: Speech normal.        Behavior: Behavior normal. Behavior is cooperative.        Thought Content: Thought content normal.        Judgment: Judgment normal.      Results for orders placed or performed in visit on 07/18/19  Comprehensive metabolic panel  Result Value Ref Range   Sodium 142 135 - 145 mEq/L   Potassium 3.9 3.5 - 5.1 mEq/L   Chloride 105 96 - 112 mEq/L   CO2 29 19 - 32 mEq/L   Glucose, Bld 93 70 - 99 mg/dL   BUN 8 6 - 23 mg/dL   Creatinine, Ser 0.62 0.40 - 1.20 mg/dL   Total Bilirubin 0.6 0.2 - 1.2 mg/dL   Alkaline Phosphatase 99 39 - 117 U/L   AST 20 0 - 37 U/L   ALT 13 0 - 35 U/L   Total Protein 6.8 6.0 - 8.3 g/dL   Albumin 4.3 3.5 - 5.2 g/dL   Calcium 9.4 8.4 - 10.5 mg/dL   GFR 99.91 >60.00 mL/min  Lipid panel  Result Value Ref Range   Cholesterol 183 0 - 200 mg/dL   Triglycerides 91.0 0.0 - 149.0 mg/dL   HDL 56.10 >39.00 mg/dL   VLDL 18.2 0.0 - 40.0 mg/dL   LDL Cholesterol 109 (H) 0 - 99 mg/dL   Total CHOL/HDL Ratio 3    NonHDL 127.38     This visit occurred during the SARS-CoV-2 public health emergency.  Safety protocols were in place, including screening questions prior to the visit, additional usage of staff PPE, and extensive cleaning of exam room while observing appropriate contact time as indicated for disinfecting solutions.    COVID 19 screen:  No recent travel or known exposure to [place] The patient denies respiratory symptoms of COVID 19 at this time. The importance of social distancing was discussed today.   Assessment and Plan    Problem List Items Addressed This Visit     Depression, major, recurrent, moderate ([place])    Well controlled when able to take name brand zoloft 50 mg daily. Cost of name brand has increased and is cost prohibitive.  Butch Penny, CMA has provided them with a coupon card for 4$ a month Zoloft.. they will try this and a prescription for 1 tablet 50 as opposed to 2 tabs of 25 mg has been resent.   She cannot take generic Zoloft given SE.. may need to try other generic SSRI if cannot afford.      Relevant Medications   ZOLOFT 50 MG tablet   Dry skin dermatitis - Primary    Treat area with topical hydrocortisone OTC BID x 2 weeks. Keep hands dry, decreased washing         Eliezer Lofts, MD

## 2021-12-06 NOTE — Assessment & Plan Note (Signed)
Well controlled when able to take name brand zoloft 50 mg daily. Cost of name brand has increased and is cost prohibitive.  Butch Penny, CMA has provided them with a coupon card for 4$ a month Zoloft.. they will try this and a prescription for 1 tablet 50 as opposed to 2 tabs of 25 mg has been resent.   She cannot take generic Zoloft given SE.. may need to try other generic SSRI if cannot afford.

## 2021-12-06 NOTE — Patient Instructions (Addendum)
Let me know if medication is not more affordable  with prescription change and coupon and we can try a different SSRi like celexa.

## 2022-01-05 NOTE — Telephone Encounter (Signed)
Okay to change decrease down to 25 mg Zoloft?

## 2022-01-06 MED ORDER — ZOLOFT 25 MG PO TABS: 25.0000 mg | ORAL_TABLET | Freq: Every day | ORAL | 5 refills | Status: DC

## 2022-01-06 NOTE — Telephone Encounter (Signed)
Rx sent as instructed by Dr. Diona Brow[facility] [address].

## 2022-01-06 NOTE — Telephone Encounter (Signed)
Yes.. okay to decrease to 25 mg daily

## 2022-07-25 ENCOUNTER — Encounter: Payer: Self-pay | Admitting: Family Medicine

## 2022-07-25 ENCOUNTER — Ambulatory Visit (INDEPENDENT_AMBULATORY_CARE_PROVIDER_SITE_OTHER): Payer: Self-pay | Admitting: Family Medicine

## 2022-07-25 VITALS — BP 126/80 | HR 70 | Temp 98.1°F | Ht 64.25 in | Wt 130.0 lb

## 2022-07-25 DIAGNOSIS — M79674 Pain in right toe(s): Secondary | ICD-10-CM

## 2022-07-25 DIAGNOSIS — F422 Mixed obsessional thoughts and acts: Secondary | ICD-10-CM

## 2022-07-25 DIAGNOSIS — F331 Major depressive disorder, recurrent, moderate: Secondary | ICD-10-CM

## 2022-07-25 DIAGNOSIS — Z91030 Bee allergy status: Secondary | ICD-10-CM

## 2022-07-25 DIAGNOSIS — F4000 Agoraphobia, unspecified: Secondary | ICD-10-CM

## 2022-07-25 DIAGNOSIS — F410 Panic disorder [episodic paroxysmal anxiety] without agoraphobia: Secondary | ICD-10-CM

## 2022-07-25 MED ORDER — EPINEPHRINE 0.3 MG/0.3ML IJ SOAJ: 0.3000 mg | INTRAMUSCULAR | 0 refills | Status: DC | PRN

## 2022-07-25 NOTE — Assessment & Plan Note (Signed)
Chronic, worsening control.  She is having issues coping with increased stress from her father living with her.  I will place a referral to psychology for counseling. She will continue Zoloft 25 mg p.o. daily

## 2022-07-25 NOTE — Patient Instructions (Signed)
Use epi pen if symptoms of shortness or breath, oral swelling and airway compromise after bee sting. Elevate foot, ice toe and buddy tape to adjacent toe.  I will place referral to Psychology.

## 2022-07-25 NOTE — Assessment & Plan Note (Signed)
Chronic  Discussed in detail when to use epi pen and when to follow up.

## 2022-07-25 NOTE — Progress Notes (Signed)
Patient ID: Jackie Ford, female    DOB: May 04, 1966, 56 y.o.   MRN: [id number]  This visit was conducted in person.  BP 126/80   Pulse 70   Temp 98.1 F (36.7 C) (Oral)   Ht 5' 4.25" (1.632 m)   Wt 130 lb (59 kg)   LMP 05/11/2015 (Approximate)   SpO2 97%   BMI 22.14 kg/m    CC:  Chief Complaint  Patient presents with   Insect Bite    Requesting Epi Pen Rx    Subjective:   HPI: Jackie Ford is a 56 y.o. female presenting on 07/25/2022 for Insect Bite (Requesting Epi Pen Rx)   She has  had a  sting by a bee on hand and fingers in 08/2021... resulted in severe swelling in hands. Had tingling in mouth, no SOB, no wheeze.  Treated with benadryl.   Had anaphylaxis with minocin.  She also reports an injury to her right great toe.  She stubbed it when kicking her husband in the shin.  He is present at the office visit today and they are laughing about the injury.  She had initial swelling and bruising which has resolved now over the past week.  She has evidence in that nail of ingrown toenail which she has cut back drastically.  She is able to walk without limp.  She is also having a significant worsening of her anxiety in the setting of her father living at their house.  She would like to speak with a psychologist for assistance with coping mechanisms to deal with this increase stress level.  She is not interested in medication change at this time     Relevant past medical, surgical, family and social history reviewed and updated as indicated. Interim medical history since our last visit reviewed. Allergies and medications reviewed and updated. Outpatient Medications Prior to Visit  Medication Sig Dispense Refill   Glucosamine-Chondroitin-Vit C 2000-1200-60 MG/30ML LIQD Take 10 mLs by mouth daily.     Hyaluronic Acid-Vitamin C (HYALURONIC ACID PO) Take 1 capsule by mouth daily.     Multiple Vitamins-Minerals (HAIR/SKIN/NAILS) TABS Take 1 tablet by mouth daily.     NON  FORMULARY Take 1 each by mouth daily. Beauty Bursts Gourmet Collagen Soft Chews     OVER THE COUNTER MEDICATION Serovital-4 tablets by mouth daily     Probiotic Product (PROBIOTIC DAILY PO) Take 1 capsule by mouth daily.     Saw Palmetto, Serenoa repens, 320 MG CAPS Take 1 capsule by mouth daily.     ZOLOFT 25 MG tablet Take 1 tablet (25 mg total) by mouth daily. 30 tablet 5   No facility-administered medications prior to visit.     Per HPI unless specifically indicated in ROS section below Review of Systems  Constitutional:  Negative for fatigue and fever.  HENT:  Negative for ear pain.   Eyes:  Negative for pain.  Respiratory:  Negative for chest tightness and shortness of breath.   Cardiovascular:  Negative for chest pain, palpitations and leg swelling.  Gastrointestinal:  Negative for abdominal pain.  Genitourinary:  Negative for dysuria.   Objective:  BP 126/80   Pulse 70   Temp 98.1 F (36.7 C) (Oral)   Ht 5' 4.25" (1.632 m)   Wt 130 lb (59 kg)   LMP 05/11/2015 (Approximate)   SpO2 97%   BMI 22.14 kg/m   Wt Readings from Last 3 Encounters:  07/25/22 130 lb (59 kg)  12/06/21  126 lb (57.2 kg)  07/26/21 119 lb 8 oz (54.2 kg)      Physical Exam Constitutional:      General: She is not in acute distress.    Appearance: Normal appearance. She is well-developed. She is not ill-appearing or toxic-appearing.  HENT:     Head: Normocephalic.     Right Ear: Hearing, tympanic membrane, ear canal and external ear normal. Tympanic membrane is not erythematous, retracted or bulging.     Left Ear: Hearing, tympanic membrane, ear canal and external ear normal. Tympanic membrane is not erythematous, retracted or bulging.     Nose: No mucosal edema or rhinorrhea.     Right Sinus: No maxillary sinus tenderness or frontal sinus tenderness.     Left Sinus: No maxillary sinus tenderness or frontal sinus tenderness.     Mouth/Throat:     Pharynx: Uvula midline.  Eyes:     General: Lids  are normal. Lids are everted, no foreign bodies appreciated.     Conjunctiva/sclera: Conjunctivae normal.     Pupils: Pupils are equal, round, and reactive to light.  Neck:     Thyroid: No thyroid mass or thyromegaly.     Vascular: No carotid bruit.     Trachea: Trachea normal.  Cardiovascular:     Rate and Rhythm: Normal rate and regular rhythm.     Pulses: Normal pulses.     Heart sounds: Normal heart sounds, S1 normal and S2 normal. No murmur heard.    No friction rub. No gallop.  Pulmonary:     Effort: Pulmonary effort is normal. No tachypnea or respiratory distress.     Breath sounds: Normal breath sounds. No decreased breath sounds, wheezing, rhonchi or rales.  Abdominal:     General: Bowel sounds are normal.     Palpations: Abdomen is soft.     Tenderness: There is no abdominal tenderness.  Musculoskeletal:     Cervical back: Normal range of motion and neck supple.  Feet:     Comments: Soreness to palpation of right great toe.  Aggressively trimmed toenail, no contusion.  Full range of motion.  Able to weight-bear. Skin:    General: Skin is warm and dry.     Findings: No rash.  Neurological:     Mental Status: She is alert.  Psychiatric:        Mood and Affect: Mood is not anxious or depressed.        Speech: Speech normal.        Behavior: Behavior normal. Behavior is cooperative.        Thought Content: Thought content normal.        Judgment: Judgment normal.       Results for orders placed or performed in visit on 07/18/19  Comprehensive metabolic panel  Result Value Ref Range   Sodium 142 135 - 145 mEq/L   Potassium 3.9 3.5 - 5.1 mEq/L   Chloride 105 96 - 112 mEq/L   CO2 29 19 - 32 mEq/L   Glucose, Bld 93 70 - 99 mg/dL   BUN 8 6 - 23 mg/dL   Creatinine, Ser 0.62 0.40 - 1.20 mg/dL   Total Bilirubin 0.6 0.2 - 1.2 mg/dL   Alkaline Phosphatase 99 39 - 117 U/L   AST 20 0 - 37 U/L   ALT 13 0 - 35 U/L   Total Protein 6.8 6.0 - 8.3 g/dL   Albumin 4.3 3.5 - 5.2  g/dL   Calcium 9.4 8.4 -  10.5 mg/dL   GFR 99.91 >60.00 mL/min  Lipid panel  Result Value Ref Range   Cholesterol 183 0 - 200 mg/dL   Triglycerides 91.0 0.0 - 149.0 mg/dL   HDL 56.10 >39.00 mg/dL   VLDL 18.2 0.0 - 40.0 mg/dL   LDL Cholesterol 109 (H) 0 - 99 mg/dL   Total CHOL/HDL Ratio 3    NonHDL 127.38      COVID 19 screen:  No recent travel or known exposure to COVID19 The patient denies respiratory symptoms of COVID 19 at this time. The importance of social distancing was discussed today.   Assessment and Plan Problem List Items Addressed This Visit     Agoraphobia   Relevant Orders   Ambulatory referral to Psychology   Bee sting allergy - Primary    Chronic  Discussed in detail when to use epi pen and when to follow up.      Depression, major, recurrent, moderate ([facility])    Chronic, worsening control.  She is having issues coping with increased stress from her father living with her.  I will place a referral to psychology for counseling. She will continue Zoloft 25 mg p.o. daily      Relevant Orders   Ambulatory referral to Psychology   Obsessive compulsive disorder   Relevant Orders   Ambulatory referral to Psychology   Pain of right great toe    Acute,like stubbed toe. No clear evidence of fracture. Offered X-ray but given significant improvement, pt is not interested in imaging at this time.      Panic attacks   Relevant Orders   Ambulatory referral to Psychology       Eliezer Lofts, MD

## 2022-07-25 NOTE — Assessment & Plan Note (Signed)
Acute,like stubbed toe. No clear evidence of fracture. Offered X-ray but given significant improvement, pt is not interested in imaging at this time.

## 2022-07-31 ENCOUNTER — Telehealth: Payer: Self-pay | Admitting: Family Medicine

## 2022-08-12 ENCOUNTER — Telehealth (HOSPITAL_COMMUNITY): Payer: Self-pay | Admitting: Psychiatry

## 2022-08-12 NOTE — Telephone Encounter (Signed)
D:  Front Desk informed case manager that pt was referred to [facility].  A:  Placed call to pt, but the google voice wouldn't let the case manager leave a vm.  Attempted to call pt's home # and it wouldn't ring.  Will attempt again tomorrow.  Inform the front desk.

## 2022-08-13 ENCOUNTER — Telehealth (HOSPITAL_COMMUNITY): Payer: Self-pay | Admitting: Psychiatry

## 2022-10-31 ENCOUNTER — Ambulatory Visit (INDEPENDENT_AMBULATORY_CARE_PROVIDER_SITE_OTHER): Payer: BC Managed Care – PPO | Admitting: Family Medicine

## 2022-10-31 ENCOUNTER — Ambulatory Visit (INDEPENDENT_AMBULATORY_CARE_PROVIDER_SITE_OTHER)
Admission: RE | Admit: 2022-10-31 | Discharge: 2022-10-31 | Disposition: A | Discharge status: Home / Self Care | Payer: BC Managed Care – PPO | Source: Ambulatory Visit | Attending: Family Medicine | Admitting: Family Medicine

## 2022-10-31 VITALS — BP 110/62 | HR 67 | Temp 97.4°F | Ht 64.25 in | Wt 127.2 lb

## 2022-10-31 DIAGNOSIS — R051 Acute cough: Secondary | ICD-10-CM | POA: Diagnosis not present

## 2022-10-31 DIAGNOSIS — T17908A Unspecified foreign body in respiratory tract, part unspecified causing other injury, initial encounter: Secondary | ICD-10-CM | POA: Insufficient documentation

## 2022-10-31 DIAGNOSIS — R059 Cough, unspecified: Secondary | ICD-10-CM | POA: Diagnosis not present

## 2022-10-31 LAB — POC COVID19 BINAXNOW: SARS Coronavirus 2 Ag: NEGATIVE

## 2022-10-31 NOTE — Assessment & Plan Note (Signed)
Negative COVID testing in office.

## 2022-10-31 NOTE — Progress Notes (Signed)
Patient ID: Jackie Ford, female    DOB: 19-Feb-1966, 56 y.o.   MRN: [id number]  This visit was conducted in person.  BP 110/62   Pulse 67   Temp (!) 97.4 F (36.3 C) (Temporal)   Ht 5' 4.25" (1.632 m)   Wt 127 lb 4 oz (57.7 kg)   LMP 05/11/2015 (Approximate)   SpO2 96%   BMI 21.67 kg/m    CC:  Chief Complaint  Patient presents with   Aspiration    Of a hot drink 4-5 days ago. On and off "burning" feeling in center of chest.     Subjective:   HPI: Jackie Ford is a 56 y.o. female presenting on 10/31/2022 for Aspiration (Of a hot drink 4-5 days ago. On and off "burning" feeling in center of chest. )   3-4 days ago   was drinking a pot cappuccino.. she inhaled the liquid when laughing.  Resulted an coughng and choking.   Since then pain in left upper chest  No throat or mouth pain but feel lung are raw feeling.  She has dry cough.  No  SOB, no wheeze.  No fever.  Lungs felt irriated by  perfume, smells.    No cold or allergy symptoms.      Relevant past medical, surgical, family and social history reviewed and updated as indicated. Interim medical history since our last visit reviewed. Allergies and medications reviewed and updated. Outpatient Medications Prior to Visit  Medication Sig Dispense Refill   EPINEPHrine 0.3 mg/0.3 mL IJ SOAJ injection Inject 0.3 mg into the muscle as needed for anaphylaxis. 2 each 0   Ferrous Sulfate (IRON) 325 (65 Fe) MG TABS Take 2 each by mouth daily.     Glucosamine-Chondroitin-Vit C 2000-1200-60 MG/30ML LIQD Take 10 mLs by mouth daily.     Hyaluronic Acid-Vitamin C (HYALURONIC ACID PO) Take 1 capsule by mouth daily.     Multiple Vitamins-Minerals (HAIR/SKIN/NAILS) TABS Take 1 tablet by mouth daily.     NON FORMULARY Take 1 each by mouth daily. Beauty Bursts Gourmet Collagen Soft Chews     OVER THE COUNTER MEDICATION Serovital-4 tablets by mouth daily     Probiotic Product (PROBIOTIC DAILY PO) Take 1 capsule by mouth daily.      Saw Palmetto, Serenoa repens, 320 MG CAPS Take 1 capsule by mouth daily.     ZOLOFT 25 MG tablet Take 1 tablet (25 mg total) by mouth daily. 30 tablet 5   No facility-administered medications prior to visit.     Per HPI unless specifically indicated in ROS section below Review of Systems  Constitutional:  Negative for fatigue and fever.  HENT:  Negative for congestion.   Eyes:  Negative for pain.  Respiratory:  Negative for cough and shortness of breath.   Cardiovascular:  Negative for chest pain, palpitations and leg swelling.  Gastrointestinal:  Negative for abdominal pain.  Genitourinary:  Negative for dysuria and vaginal bleeding.  Musculoskeletal:  Negative for back pain.  Neurological:  Negative for syncope, light-headedness and headaches.  Psychiatric/Behavioral:  Negative for dysphoric mood.    Objective:  BP 110/62   Pulse 67   Temp (!) 97.4 F (36.3 C) (Temporal)   Ht 5' 4.25" (1.632 m)   Wt 127 lb 4 oz (57.7 kg)   LMP 05/11/2015 (Approximate)   SpO2 96%   BMI 21.67 kg/m   Wt Readings from Last 3 Encounters:  10/31/22 127 lb 4 oz (57.7 kg)  07/25/22 130 lb (59 kg)  12/06/21 126 lb (57.2 kg)      Physical Exam Constitutional:      General: She is not in acute distress.    Appearance: Normal appearance. She is well-developed. She is not ill-appearing or toxic-appearing.  HENT:     Head: Normocephalic.     Right Ear: Hearing, tympanic membrane, ear canal and external ear normal. Tympanic membrane is not erythematous, retracted or bulging.     Left Ear: Hearing, tympanic membrane, ear canal and external ear normal. Tympanic membrane is not erythematous, retracted or bulging.     Nose: No mucosal edema or rhinorrhea.     Right Sinus: No maxillary sinus tenderness or frontal sinus tenderness.     Left Sinus: No maxillary sinus tenderness or frontal sinus tenderness.     Mouth/Throat:     Pharynx: Uvula midline.  Eyes:     General: Lids are normal. Lids are  everted, no foreign bodies appreciated.     Conjunctiva/sclera: Conjunctivae normal.     Pupils: Pupils are equal, round, and reactive to light.  Neck:     Thyroid: No thyroid mass or thyromegaly.     Vascular: No carotid bruit.     Trachea: Trachea normal.  Cardiovascular:     Rate and Rhythm: Normal rate and regular rhythm.     Pulses: Normal pulses.     Heart sounds: Normal heart sounds, S1 normal and S2 normal. No murmur heard.    No friction rub. No gallop.  Pulmonary:     Effort: Pulmonary effort is normal. No tachypnea or respiratory distress.     Breath sounds: Normal breath sounds. No decreased breath sounds, wheezing, rhonchi or rales.  Abdominal:     General: Bowel sounds are normal.     Palpations: Abdomen is soft.     Tenderness: There is no abdominal tenderness.  Musculoskeletal:     Cervical back: Normal range of motion and neck supple.  Skin:    General: Skin is warm and dry.     Findings: No rash.  Neurological:     Mental Status: She is alert.  Psychiatric:        Mood and Affect: Mood is not anxious or depressed.        Speech: Speech normal.        Behavior: Behavior normal. Behavior is cooperative.        Thought Content: Thought content normal.        Judgment: Judgment normal.       Results for orders placed or performed in visit on 07/18/19  Comprehensive metabolic panel  Result Value Ref Range   Sodium 142 135 - 145 mEq/L   Potassium 3.9 3.5 - 5.1 mEq/L   Chloride 105 96 - 112 mEq/L   CO2 29 19 - 32 mEq/L   Glucose, Bld 93 70 - 99 mg/dL   BUN 8 6 - 23 mg/dL   Creatinine, Ser 0.62 0.40 - 1.20 mg/dL   Total Bilirubin 0.6 0.2 - 1.2 mg/dL   Alkaline Phosphatase 99 39 - 117 U/L   AST 20 0 - 37 U/L   ALT 13 0 - 35 U/L   Total Protein 6.8 6.0 - 8.3 g/dL   Albumin 4.3 3.5 - 5.2 g/dL   Calcium 9.4 8.4 - 10.5 mg/dL   GFR 99.91 >60.00 mL/min  Lipid panel  Result Value Ref Range   Cholesterol 183 0 - 200 mg/dL   Triglycerides 91.0 0.0 -  149.0 mg/dL    HDL 56.10 >39.00 mg/dL   VLDL 18.2 0.0 - 40.0 mg/dL   LDL Cholesterol 109 (H) 0 - 99 mg/dL   Total CHOL/HDL Ratio 3    NonHDL 127.38      COVID 19 screen:  No recent travel or known exposure to COVID19 The patient denies respiratory symptoms of COVID 19 at this time. The importance of social distancing was discussed today.   Assessment and Plan    Problem List Items Addressed This Visit     Acute cough    Negative COVID testing in office.       Relevant Orders   DG Chest 2 View   POC COVID-19 BinaxNow (Completed)   Aspiration into airway - Primary    Am doubtful of true aspiration given she has no swallowing difficulties and normal cough reflex but given patient concern level and persistent cough I will check a chest x-ray today to rule out pneumonia or aspiration pneumonia.  Of note lung exam was clear to auscultation.  Most likely symptoms are due to upper airway irritation.  Symptomatic care recommended.      Relevant Orders   DG Chest 2 View     Eliezer Lofts, MD

## 2022-10-31 NOTE — Assessment & Plan Note (Signed)
Am doubtful of true aspiration given she has no swallowing difficulties and normal cough reflex but given patient concern level and persistent cough I will check a chest x-ray today to rule out pneumonia or aspiration pneumonia.  Of note lung exam was clear to auscultation.  Most likely symptoms are due to upper airway irritation.  Symptomatic care recommended.

## 2023-02-28 ENCOUNTER — Encounter: Payer: Self-pay | Admitting: Family Medicine

## 2023-02-28 ENCOUNTER — Other Ambulatory Visit: Payer: Self-pay | Admitting: Family Medicine

## 2023-03-01 MED ORDER — EPINEPHRINE 0.3 MG/0.3ML IJ SOAJ
0.3000 mg | INTRAMUSCULAR | 0 refills | Status: AC | PRN
  Filled 2023-03-01: qty 2, 4d supply, fill #0

## 2023-03-01 MED ORDER — IRON 325 (65 FE) MG PO TABS
2.0000 | ORAL_TABLET | Freq: Every day | ORAL | 0 refills | Status: DC
  Filled 2023-03-01: qty 60, 30d supply, fill #0

## 2023-03-01 NOTE — Telephone Encounter (Signed)
Last office visit 10/31/22 for aspiration into airway and acute cough.  Alprazolam is not on current medication list.  No future appointment.  Last CPE 10/11/2019.  Refill?

## 2023-03-02 ENCOUNTER — Other Ambulatory Visit (HOSPITAL_COMMUNITY): Payer: Self-pay

## 2023-03-02 ENCOUNTER — Other Ambulatory Visit: Payer: Self-pay

## 2023-03-02 ENCOUNTER — Telehealth: Payer: Self-pay | Admitting: *Deleted

## 2023-03-02 DIAGNOSIS — Z114 Encounter for screening for human immunodeficiency virus [HIV]: Secondary | ICD-10-CM

## 2023-03-02 DIAGNOSIS — Z13220 Encounter for screening for lipoid disorders: Secondary | ICD-10-CM

## 2023-03-02 MED ORDER — ZOLOFT 25 MG PO TABS: 25.0000 mg | ORAL_TABLET | Freq: Every day | ORAL | 0 refills | Status: DC

## 2023-03-02 NOTE — Telephone Encounter (Signed)
[facility] [address].  Will forward Alprazolam request to Dr. Diona Browner.

## 2023-03-02 NOTE — Telephone Encounter (Signed)
-----   Message from Velna Hatchet, RT sent at 03/02/2023 11:12 AM EST ----- Regarding: Fri 3/15 lab Patient is scheduled for cpx, please order future labs.  Thanks, Anda Kraft

## 2023-03-03 ENCOUNTER — Other Ambulatory Visit (HOSPITAL_COMMUNITY): Payer: Self-pay

## 2023-03-03 MED ORDER — ALPRAZOLAM 0.25 MG PO TABS: 0.2500 mg | ORAL_TABLET | Freq: Every day | ORAL | 0 refills | Status: AC | PRN

## 2023-03-03 NOTE — Telephone Encounter (Signed)
Refilled alprazolam.. no further refills until CPX.

## 2023-03-05 ENCOUNTER — Other Ambulatory Visit (HOSPITAL_BASED_OUTPATIENT_CLINIC_OR_DEPARTMENT_OTHER): Payer: Self-pay

## 2023-03-05 ENCOUNTER — Other Ambulatory Visit (HOSPITAL_COMMUNITY): Payer: Self-pay

## 2023-03-05 ENCOUNTER — Encounter (HOSPITAL_COMMUNITY): Payer: Self-pay

## 2023-03-08 ENCOUNTER — Other Ambulatory Visit: Payer: Self-pay | Admitting: Family Medicine

## 2023-03-13 ENCOUNTER — Other Ambulatory Visit (INDEPENDENT_AMBULATORY_CARE_PROVIDER_SITE_OTHER): Payer: BC Managed Care – PPO

## 2023-03-13 DIAGNOSIS — Z114 Encounter for screening for human immunodeficiency virus [HIV]: Secondary | ICD-10-CM

## 2023-03-13 DIAGNOSIS — Z13220 Encounter for screening for lipoid disorders: Secondary | ICD-10-CM

## 2023-03-13 LAB — COMPREHENSIVE METABOLIC PANEL
ALT: 11 U/L (ref 0–35)
AST: 19 U/L (ref 0–37)
Albumin: 3.9 g/dL (ref 3.5–5.2)
Alkaline Phosphatase: 125 U/L — ABNORMAL HIGH (ref 39–117)
BUN: 6 mg/dL (ref 6–23)
CO2: 31 mEq/L (ref 19–32)
Calcium: 9.5 mg/dL (ref 8.4–10.5)
Chloride: 104 mEq/L (ref 96–112)
Creatinine, Ser: 0.63 mg/dL (ref 0.40–1.20)
GFR: 97.58 mL/min (ref >60.00)
Glucose, Bld: 94 mg/dL (ref 70–99)
Potassium: 4.1 mEq/L (ref 3.5–5.1)
Sodium: 141 mEq/L (ref 135–145)
Total Bilirubin: 0.5 mg/dL (ref 0.2–1.2)
Total Protein: 6.4 g/dL (ref 6.0–8.3)

## 2023-03-13 LAB — LIPID PANEL
Cholesterol: 197 mg/dL (ref 0–200)
HDL: 53.7 mg/dL (ref >39.00)
LDL Cholesterol: 120 mg/dL — ABNORMAL HIGH (ref 0–99)
NonHDL: 143.69
Total CHOL/HDL Ratio: 4
Triglycerides: 117 mg/dL (ref 0.0–149.0)
VLDL: 23.4 mg/dL (ref 0.0–40.0)

## 2023-03-13 NOTE — Progress Notes (Signed)
No critical labs need to be addressed urgently. We will discuss labs in detail at upcoming office visit.   

## 2023-03-16 LAB — HIV ANTIBODY (ROUTINE TESTING W REFLEX): HIV 1&2 Ab, 4th Generation: NONREACTIVE

## 2023-03-17 ENCOUNTER — Encounter: Payer: Self-pay | Admitting: Family Medicine

## 2023-03-17 NOTE — Progress Notes (Signed)
No critical labs need to be addressed urgently. We will discuss labs in detail at upcoming office visit.   

## 2023-03-20 ENCOUNTER — Other Ambulatory Visit (HOSPITAL_COMMUNITY)
Admission: RE | Admit: 2023-03-20 | Discharge: 2023-03-20 | Disposition: A | Discharge status: Home / Self Care | Payer: BC Managed Care – PPO | Source: Ambulatory Visit | Attending: Family Medicine | Admitting: Family Medicine

## 2023-03-20 ENCOUNTER — Encounter: Payer: Self-pay | Admitting: Family Medicine

## 2023-03-20 ENCOUNTER — Ambulatory Visit (INDEPENDENT_AMBULATORY_CARE_PROVIDER_SITE_OTHER): Payer: BC Managed Care – PPO | Admitting: Family Medicine

## 2023-03-20 VITALS — BP 120/68 | HR 66 | Temp 97.9°F | Ht 64.75 in | Wt 128.2 lb

## 2023-03-20 DIAGNOSIS — Z124 Encounter for screening for malignant neoplasm of cervix: Secondary | ICD-10-CM

## 2023-03-20 DIAGNOSIS — Z1211 Encounter for screening for malignant neoplasm of colon: Secondary | ICD-10-CM

## 2023-03-20 DIAGNOSIS — Z0000 Encounter for general adult medical examination without abnormal findings: Secondary | ICD-10-CM | POA: Diagnosis not present

## 2023-03-20 DIAGNOSIS — D6941 Evans syndrome: Secondary | ICD-10-CM

## 2023-03-20 DIAGNOSIS — D6861 Antiphospholipid syndrome: Secondary | ICD-10-CM

## 2023-03-20 DIAGNOSIS — F331 Major depressive disorder, recurrent, moderate: Secondary | ICD-10-CM | POA: Diagnosis not present

## 2023-03-20 LAB — CBC WITH DIFFERENTIAL/PLATELET
Absolute Monocytes: 410 cells/uL (ref 200–950)
Basophils Absolute: 40 cells/uL (ref 0–200)
Basophils Relative: 0.8 %
Eosinophils Absolute: 70 cells/uL (ref 15–500)
Eosinophils Relative: 1.4 %
HCT: 41.5 % (ref 35.0–45.0)
Hemoglobin: 14.1 g/dL (ref 11.7–15.5)
Lymphs Abs: 2310 cells/uL (ref 850–3900)
MCH: 29.1 pg (ref 27.0–33.0)
MCHC: 34 g/dL (ref 32.0–36.0)
MCV: 85.6 fL (ref 80.0–100.0)
MPV: 11 fL (ref 7.5–12.5)
Monocytes Relative: 8.2 %
Neutro Abs: 2170 cells/uL (ref 1500–7800)
Neutrophils Relative %: 43.4 %
Platelets: 305 10*3/uL (ref 140–400)
RBC: 4.85 10*6/uL (ref 3.80–5.10)
RDW: 12.9 % (ref 11.0–15.0)
Total Lymphocyte: 46.2 %
WBC: 5 10*3/uL (ref 3.8–10.8)

## 2023-03-20 MED ORDER — IRON 325 (65 FE) MG PO TABS: 2.0000 | ORAL_TABLET | Freq: Every day | ORAL | 0 refills | Status: DC

## 2023-03-20 MED ORDER — ZOLOFT 25 MG PO TABS: 25.0000 mg | ORAL_TABLET | Freq: Every day | ORAL | 0 refills | Status: DC

## 2023-03-20 NOTE — Progress Notes (Signed)
Chief Complaint  Patient presents with   Annual Exam    History of Present Illness: HPI  The patient is here for annual wellness exam and preventative care.    Reviewed labs in detail.   Panic attacks , agoraphobia, MDD: well controlled on zoloft low dose.  No SE.    Alprazolam prn.    03/20/2023    3:09 PM 12/06/2021    3:04 PM 01/25/2021   12:39 PM  Depression screen PHQ 2/9  Decreased Interest 0 0 0  Down, Depressed, Hopeless 0 1 1  PHQ - 2 Score 0 1 1  Altered sleeping 3 3 3   Tired, decreased energy 2 1 3   Change in appetite 0 0 0  Feeling bad or failure about yourself  0 0 1  Trouble concentrating 0 2 0  Moving slowly or fidgety/restless 0 0 0  Suicidal thoughts 0 0 0  PHQ-9 Score 5 7 8   Difficult doing work/chores Somewhat difficult Not difficult at all Not difficult at all      03/20/2023    3:10 PM 03/19/2018    3:41 PM 05/29/2017    3:14 PM  GAD 7 : Generalized Anxiety Score  Nervous, Anxious, on Edge 3 1 2   Control/stop worrying 0 1 1  Worry too much - different things 0 1 1  Trouble relaxing 3 2 3   Restless 0 2 0  Easily annoyed or irritable 3 0 0  Afraid - awful might happen 0 0 3  Total GAD 7 Score 9 7 10   Anxiety Difficulty Somewhat difficult  Somewhat difficult   Wt Readings from Last 3 Encounters:  03/20/23 128 lb 4 oz (58.2 kg)  10/31/22 127 lb 4 oz (57.7 kg)  07/25/22 130 lb (59 kg)     Cholesterol elevated  more than in past.   The 10-year ASCVD risk score (Arnett DK, et al., 2019) is: 2.4%   Values used to calculate the score:     Age: 57 years     Sex: Female     Is Non-Hispanic African American: No     Diabetic: No     Tobacco smoker: No     Systolic Blood Pressure: 123456 mmHg     Is BP treated: No     HDL Cholesterol: 53.7 mg/dL     Total Cholesterol: 197 mg/dL   Diet:  moderate.. she has been eating more fat in diet since father living with them. Exercise: doing leg lifts and sit ups.   COVID 19 screen No recent travel or known  exposure to COVID19 The patient denies respiratory symptoms of COVID 19 at this time.  The importance of social distancing was discussed today.   Review of Systems  Constitutional:  Negative for chills and fever.  HENT:  Negative for congestion and ear pain.   Eyes:  Negative for pain and redness.  Respiratory:  Negative for cough and shortness of breath.   Cardiovascular:  Negative for chest pain, palpitations and leg swelling.  Gastrointestinal:  Negative for abdominal pain, blood in stool, constipation, diarrhea, nausea and vomiting.  Genitourinary:  Negative for dysuria.  Musculoskeletal:  Negative for falls and myalgias.  Skin:  Negative for rash.  Neurological:  Negative for dizziness.  Psychiatric/Behavioral:  Negative for depression. The patient is not nervous/anxious.       Past Medical History:  Diagnosis Date   Bronchitis     reports that she has quit smoking. She has never used smokeless tobacco. She  reports that she does not drink alcohol and does not use drugs.   Current Outpatient Medications:    ALPRAZolam (XANAX) 0.25 MG tablet, Take 1 tablet (0.25 mg total) by mouth daily as needed for anxiety., Disp: 30 tablet, Rfl: 0   EPINEPHrine 0.3 mg/0.3 mL IJ SOAJ injection, Inject 0.3 mg into the muscle as needed for anaphylaxis., Disp: 2 each, Rfl: 0   Glucosamine-Chondroitin-Vit C 2000-1200-60 MG/30ML LIQD, Take 10 mLs by mouth daily., Disp: , Rfl:    Hyaluronic Acid-Vitamin C (HYALURONIC ACID PO), Take 1 capsule by mouth daily., Disp: , Rfl:    Multiple Vitamins-Minerals (HAIR/SKIN/NAILS) TABS, Take 1 tablet by mouth daily., Disp: , Rfl:    NON FORMULARY, Take 1 each by mouth daily. Beauty Bursts Gourmet Collagen Soft Chews, Disp: , Rfl:    OVER THE COUNTER MEDICATION, Serovital-4 tablets by mouth daily, Disp: , Rfl:    Probiotic Product (PROBIOTIC DAILY PO), Take 1 capsule by mouth daily., Disp: , Rfl:    Saw Palmetto, Serenoa repens, 320 MG CAPS, Take 1 capsule by mouth  daily., Disp: , Rfl:    Ferrous Sulfate (IRON) 325 (65 Fe) MG TABS, Take 2 tablets (650 mg total) by mouth daily., Disp: 180 tablet, Rfl: 0   ZOLOFT 25 MG tablet, Take 1 tablet (25 mg total) by mouth daily., Disp: 90 tablet, Rfl: 0   Observations/Objective: Last menstrual period 05/11/2015.  Physical Exam Constitutional:      General: She is not in acute distress.    Appearance: Normal appearance. She is well-developed. She is not ill-appearing or toxic-appearing.  HENT:     Head: Normocephalic.     Right Ear: Hearing, tympanic membrane, ear canal and external ear normal.     Left Ear: Hearing, tympanic membrane, ear canal and external ear normal.     Nose: Nose normal.  Eyes:     General: Lids are normal. Lids are everted, no foreign bodies appreciated.     Conjunctiva/sclera: Conjunctivae normal.     Pupils: Pupils are equal, round, and reactive to light.  Neck:     Thyroid: No thyroid mass or thyromegaly.     Vascular: No carotid bruit.     Trachea: Trachea normal.  Cardiovascular:     Rate and Rhythm: Normal rate and regular rhythm.     Heart sounds: Normal heart sounds, S1 normal and S2 normal. No murmur heard.    No gallop.  Pulmonary:     Effort: Pulmonary effort is normal. No respiratory distress.     Breath sounds: Normal breath sounds. No wheezing, rhonchi or rales.  Abdominal:     General: Bowel sounds are normal. There is no distension or abdominal bruit.     Palpations: Abdomen is soft. There is no fluid wave or mass.     Tenderness: There is no abdominal tenderness. There is no guarding or rebound.     Hernia: No hernia is present.  Genitourinary:    Exam position: Supine.     Labia:        Right: No rash, tenderness or lesion.        Left: No rash, tenderness or lesion.      Vagina: Normal.     Cervix: No cervical motion tenderness, discharge or friability.     Uterus: Not enlarged and not tender.      Adnexa:        Right: No mass, tenderness or fullness.          Left:  No mass, tenderness or fullness.    Musculoskeletal:     Cervical back: Normal range of motion and neck supple.  Lymphadenopathy:     Cervical: No cervical adenopathy.  Skin:    General: Skin is warm and dry.     Findings: No rash.  Neurological:     Mental Status: She is alert.     Cranial Nerves: No cranial nerve deficit.     Sensory: No sensory deficit.  Psychiatric:        Mood and Affect: Mood is not anxious or depressed.        Speech: Speech normal.        Behavior: Behavior normal. Behavior is cooperative.        Judgment: Judgment normal.      Assessment and Plan   The patient's preventative maintenance and recommended screening tests for an annual wellness exam were reviewed in full today. Brought up to date unless services declined.  Counselled on the importance of diet, exercise, and its role in overall health and mortality. The patient's FH and SH was reviewed, including their home life, tobacco status, and drug and alcohol status.   Vaccines: Uptodate with Tdap, flu, discussed COVID and shingrix vaccine Mammo: nml 2019, DUE DVE/PAP: nml  Pap 2019, plan repeat q 5 years, DUE Colon: father dx age 83 with colon cancer. ifob negative 2017, DUE        Hep C: neg  HIV: refused   Depression, major, recurrent, moderate (HCC) Stable, chronic.  Continue current medication.   Brand Name  Zoloft  25 mg daily.  Using alprazolam daily prn.   family history of Evan's syndrome  Will re-evaluate with cbc    Eliezer Lofts, MD

## 2023-03-20 NOTE — Patient Instructions (Addendum)
Get back to regular exercise and low cholesterol diet.  Pick up stool test in lab.  Please stop at the lab to have labs drawn.    Please call the location of your choice from the menu below to schedule your Mammogram and/or Bone Density appointment.    [facility]                      Phone:  [phone], [place] [address]                                                             Services: Traditional and 3D Mammogram, [place] [facility]                 Phone: [phone] [address], [place] [address]    Service: Bone Density ONLY   *this site does NOT perform mammograms  [facility]                        Phone:  [phone] [address], [place] [address]                                            Services:  3D Mammogram and [place] at [facility]   Phone:  [phone]   [place], [place] [address]                                            Services: 3D Mammogram and [place]  [place] at [facility])  Phone:  [phone]   [address]  [place], [place] [address]                                              Services:  3D Mammogram and Bone Density

## 2023-03-20 NOTE — Addendum Note (Signed)
Addended by: Ellamae Sia on: 03/20/2023 04:03 PM   Modules accepted: Orders

## 2023-03-20 NOTE — Addendum Note (Signed)
Addended by: Ellamae Sia on: 03/20/2023 04:06 PM   Modules accepted: Orders

## 2023-03-20 NOTE — Assessment & Plan Note (Signed)
Will re-evaluate with cbc

## 2023-03-20 NOTE — Assessment & Plan Note (Signed)
Stable, chronic.  Continue current medication.   Brand Name  Zoloft  25 mg daily.  Using alprazolam daily prn.

## 2023-03-20 NOTE — Addendum Note (Signed)
Addended by: Ellamae Sia on: 03/20/2023 04:04 PM   Modules accepted: Orders

## 2023-03-21 LAB — IRON, TOTAL/TOTAL IRON BINDING CAP
%SAT: 34 % (calc) (ref 16–45)
Iron: 96 ug/dL (ref 45–160)
TIBC: 281 mcg/dL (calc) (ref 250–450)

## 2023-03-21 LAB — FERRITIN: Ferritin: 45 ng/mL (ref 16–232)

## 2023-03-24 LAB — CYTOLOGY - PAP
Comment: NEGATIVE
Diagnosis: NEGATIVE
High risk HPV: NEGATIVE

## 2023-03-31 ENCOUNTER — Encounter: Payer: Self-pay | Admitting: *Deleted

## 2023-06-19 ENCOUNTER — Other Ambulatory Visit: Payer: Self-pay | Admitting: Family Medicine

## 2023-10-11 ENCOUNTER — Other Ambulatory Visit: Payer: Self-pay | Admitting: Family Medicine

## 2024-05-04 ENCOUNTER — Other Ambulatory Visit: Payer: Self-pay | Admitting: Family Medicine

## 2024-05-04 NOTE — Telephone Encounter (Signed)
 Please schedule CPE with fasting labs prior with Dr. Cherlyn Cornet. Please sent back to me once scheduled.

## 2024-11-02 ENCOUNTER — Other Ambulatory Visit: Payer: Self-pay | Admitting: Family Medicine

## 2024-11-03 ENCOUNTER — Telehealth: Payer: Self-pay | Admitting: Family Medicine

## 2024-11-03 NOTE — Telephone Encounter (Signed)
 Copied from CRM #[id number]. Topic: Clinical - Medication Question >> Nov 03, 2024 10:04 AM Olam RAMAN wrote: Reason for CRM: Spouse is calling about Refused Sertraline  HCl 25 mg Oral Daily was denied and stated pt needs her medication until her upcomming appt and doesn't know why she has to be seen first CB [phone] Franky

## 2024-11-04 ENCOUNTER — Other Ambulatory Visit: Payer: Self-pay | Admitting: Family Medicine

## 2024-11-04 ENCOUNTER — Other Ambulatory Visit (HOSPITAL_COMMUNITY): Payer: Self-pay

## 2024-11-04 MED ORDER — ZOLOFT 25 MG PO TABS
25.0000 mg | ORAL_TABLET | Freq: Every day | ORAL | 0 refills | Status: DC
  Filled 2024-11-04: qty 30, 30d supply, fill #0

## 2024-11-04 MED ORDER — ZOLOFT 25 MG PO TABS: 25.0000 mg | ORAL_TABLET | Freq: Every day | ORAL | 0 refills | Status: AC

## 2024-11-04 NOTE — Telephone Encounter (Signed)
 Clinical staff are unable to send refill due to patient not being up to date on follow ups.  Has an appointment scheduled 11/10/24.  Refill is pended and attached.

## 2024-11-04 NOTE — Telephone Encounter (Signed)
 Per duplicate open message this has been addressed and patient informed.

## 2024-11-04 NOTE — Addendum Note (Signed)
 Addended by: SEBASTIAN KNEE on: 11/04/2024 01:45 PM   Modules accepted: Orders

## 2024-11-07 ENCOUNTER — Other Ambulatory Visit (HOSPITAL_COMMUNITY): Payer: Self-pay

## 2024-11-07 NOTE — Telephone Encounter (Signed)
 CPE scheduled for 11/11/24.  Okay to refill until then or do we want to wait until see is seen on Friday.  Last office visit 03/20/23.

## 2024-11-08 NOTE — Telephone Encounter (Signed)
 We can wait till her appointment.

## 2024-11-10 ENCOUNTER — Encounter: Admitting: Family Medicine

## 2024-11-11 ENCOUNTER — Encounter: Admitting: Family Medicine

## 2025-01-21 ENCOUNTER — Other Ambulatory Visit: Payer: Self-pay | Admitting: Family Medicine
